# Patient Record
Sex: Female | Born: 1975 | Race: White | Hispanic: No | Smoking: Never smoker
Health system: Southern US, Community
[De-identification: ages and names within clinical notes are randomized; demographics above are authoritative.]

## PROBLEM LIST (undated history)

## (undated) DIAGNOSIS — M797 Fibromyalgia: Secondary | ICD-10-CM

## (undated) DIAGNOSIS — G43019 Migraine without aura, intractable, without status migrainosus: Secondary | ICD-10-CM

## (undated) DIAGNOSIS — J45909 Unspecified asthma, uncomplicated: Secondary | ICD-10-CM

## (undated) DIAGNOSIS — F329 Major depressive disorder, single episode, unspecified: Secondary | ICD-10-CM

## (undated) DIAGNOSIS — F32A Depression, unspecified: Secondary | ICD-10-CM

## (undated) HISTORY — PX: DILATION AND CURETTAGE OF UTERUS: SHX78

## (undated) HISTORY — PX: ABDOMINAL HYSTERECTOMY: SHX81

## (undated) HISTORY — DX: Depression, unspecified: F32.A

## (undated) HISTORY — DX: Migraine without aura, intractable, without status migrainosus: G43.019

## (undated) HISTORY — DX: Major depressive disorder, single episode, unspecified: F32.9

## (undated) HISTORY — DX: Fibromyalgia: M79.7

---

## 1999-03-07 ENCOUNTER — Other Ambulatory Visit: Admission: RE | Admit: 1999-03-07 | Discharge: 1999-03-07 | Payer: Self-pay | Admitting: Obstetrics and Gynecology

## 1999-03-26 ENCOUNTER — Inpatient Hospital Stay (HOSPITAL_COMMUNITY): Admission: AD | Admit: 1999-03-26 | Discharge: 1999-03-26 | Payer: Self-pay | Admitting: Obstetrics and Gynecology

## 1999-04-05 ENCOUNTER — Inpatient Hospital Stay (HOSPITAL_COMMUNITY): Admission: AD | Admit: 1999-04-05 | Discharge: 1999-04-05 | Payer: Self-pay | Admitting: Obstetrics and Gynecology

## 1999-04-27 ENCOUNTER — Ambulatory Visit (HOSPITAL_COMMUNITY): Admission: RE | Admit: 1999-04-27 | Discharge: 1999-04-27 | Payer: Self-pay | Admitting: Obstetrics & Gynecology

## 2000-03-06 ENCOUNTER — Other Ambulatory Visit: Admission: RE | Admit: 2000-03-06 | Discharge: 2000-03-06 | Payer: Self-pay | Admitting: Obstetrics and Gynecology

## 2000-06-24 ENCOUNTER — Ambulatory Visit (HOSPITAL_COMMUNITY): Admission: RE | Admit: 2000-06-24 | Discharge: 2000-06-24 | Payer: Self-pay | Admitting: Obstetrics & Gynecology

## 2000-06-24 ENCOUNTER — Encounter: Payer: Self-pay | Admitting: Obstetrics & Gynecology

## 2001-07-17 ENCOUNTER — Other Ambulatory Visit: Admission: RE | Admit: 2001-07-17 | Discharge: 2001-07-17 | Payer: Self-pay | Admitting: Obstetrics & Gynecology

## 2002-02-22 ENCOUNTER — Inpatient Hospital Stay (HOSPITAL_COMMUNITY): Admission: AD | Admit: 2002-02-22 | Discharge: 2002-02-22 | Payer: Self-pay | Admitting: Obstetrics & Gynecology

## 2002-05-06 ENCOUNTER — Ambulatory Visit: Admission: RE | Admit: 2002-05-06 | Discharge: 2002-05-06 | Payer: Self-pay | Admitting: Obstetrics and Gynecology

## 2002-08-24 ENCOUNTER — Inpatient Hospital Stay (HOSPITAL_COMMUNITY): Admission: AD | Admit: 2002-08-24 | Discharge: 2002-08-26 | Payer: Self-pay | Admitting: Obstetrics & Gynecology

## 2002-09-21 ENCOUNTER — Other Ambulatory Visit: Admission: RE | Admit: 2002-09-21 | Discharge: 2002-09-21 | Payer: Self-pay | Admitting: Obstetrics & Gynecology

## 2003-11-14 ENCOUNTER — Other Ambulatory Visit: Admission: RE | Admit: 2003-11-14 | Discharge: 2003-11-14 | Payer: Self-pay | Admitting: Obstetrics & Gynecology

## 2004-02-01 ENCOUNTER — Encounter: Admission: RE | Admit: 2004-02-01 | Discharge: 2004-02-01 | Payer: Self-pay | Admitting: Internal Medicine

## 2004-11-21 ENCOUNTER — Other Ambulatory Visit: Admission: RE | Admit: 2004-11-21 | Discharge: 2004-11-21 | Payer: Self-pay | Admitting: Obstetrics & Gynecology

## 2005-12-10 ENCOUNTER — Other Ambulatory Visit: Admission: RE | Admit: 2005-12-10 | Discharge: 2005-12-10 | Payer: Self-pay | Admitting: Obstetrics & Gynecology

## 2006-07-25 ENCOUNTER — Emergency Department (HOSPITAL_COMMUNITY): Admission: EM | Admit: 2006-07-25 | Discharge: 2006-07-25 | Payer: Self-pay | Admitting: Emergency Medicine

## 2006-08-15 ENCOUNTER — Ambulatory Visit (HOSPITAL_COMMUNITY): Admission: RE | Admit: 2006-08-15 | Discharge: 2006-08-15 | Payer: Self-pay | Admitting: *Deleted

## 2011-03-22 NOTE — Discharge Summary (Signed)
NAMEBAYLI, QUESINBERRY                            ACCOUNT NO.:  [id number]   MEDICAL RECORD NO.:  [id number]                   PATIENT TYPE:  INP   LOCATION:  9140                                 FACILITY:  [facility]   PHYSICIAN:  Gerrit Friends. Aldona Bar, M.D.                DATE OF BIRTH:  1976-02-03   DATE OF ADMISSION:  08/24/2002  DATE OF DISCHARGE:  08/26/2002                                 DISCHARGE SUMMARY   DISCHARGE DIAGNOSES:  1. Term pregnancy, delivered 9 pound 1 ounce female infant, Apgars 9 and 9.  2. Blood type A positive.  3. Post dates.  4. Positive group B strep.  5. Induction of labor.   PROCEDURES:  1. Vacuum extracted assisted delivery.  2. Second degree tear and repair.   SUMMARY:  This 35 year old gravida 5 para 0 was admitted for induction  because of being post dates [facility] August 11, 2002).  Her pregnancy was  complicated by her history of multiple miscarriages.  During this pregnancy  she was treated with supplemental progesterone vaginal suppositories post  ovulation until [redacted] weeks gestation.  Her pregnancy was also complicated by  hyperemesis and by being post dates.  Her group B strep was positive  antenatally.   At the time of admission she was 3 cm dilated, 80% effaced.  She underwent  amniotomy with production of slightly stained meconium fluid.  Induction  proceeded.  She received intrapartum antibiotic prophylaxis for being  positive for group B strep, and received Pitocin augmentation as well.  After pushing for over two hours with the vertex at +3 station the vacuum  extractor was applied, and after one contraction/pull she was delivered of a  9 pound 1 ounce female infant with Apgars of 9 and 9 over a second degree tear  which was repaired without difficulty.  Her postpartum course was  uncomplicated.  On the morning of October 23 she was ambulating well,  tolerating a regular diet well, having normal bowel and bladder function,  and was bottle feeding without  difficulty, vital signs were stable, and she  was deemed ready for discharge and accordingly wanted to go home.  Discharge  hemoglobin 9.1 with a white count of 18,8000 and a platelet count of  218,000.   DISCHARGE MEDICATIONS:  1. Vitamins - one a day until gone.  2. Ferrous sulfate 300 mg daily until return to the office.  3. Motrin 600 mg q.6h. as needed for pain.  4. Tylox one to two q.4-6h. as needed for severe pain.   DISCHARGE INSTRUCTIONS:  All appropriate instructions were given to the  patient per discharge brochure and were well understood by the patient.  Discharge medications as above.    FOLLOW-UP:  Follow-up in the office in approximately four weeks time.   CONDITION ON DISCHARGE:  Improved.  Gerrit Friends. Aldona Bar, M.D.    RMW/MEDQ  D:  08/26/2002  T:  08/26/2002  Job:  [id number]

## 2011-03-22 NOTE — Cardiovascular Report (Signed)
Barbara Travis, Barbara Travis                  ACCOUNT NO.:  [id number]   MEDICAL RECORD NO.:  [id number]          PATIENT TYPE:  OIB   LOCATION:  2899                         FACILITY:  [facility]   PHYSICIAN:  Darlin Priestly, MD  DATE OF BIRTH:  1976/08/26   DATE OF PROCEDURE:  08/15/2006  DATE OF DISCHARGE:  08/15/2006                              CARDIAC CATHETERIZATION   PROCEDURE:  Heads-up tilt table testing with Isuprel infusion.   COMPLICATIONS:  None.   INDICATION:  [name] is a 35 year old female patient of Dr. Nanetta Batty  with a history of presyncope and syncope.  She is now referred for tilt-  table testing to rule out neurocardiogenic source.   DESCRIPTION OF PROCEDURE:  After obtaining informed consent, patient brought  to the cardiac cath lab in a fasting state.  She was then placed on the tilt  table in the supine position.  Hemodynamic measures were obtained.  Resting  blood pressure 105/68, resting heart rate 70.  She was then monitored for  approximately 5 minutes.  She was then tilted to a 70 degree heads-up  position, which she maintained for 30 minutes without any significant  symptoms.  She was again returned to the supine position and Isuprel  infusion was begun at 0.5 mcg.  This was continued for approximately for 5  minutes, then increased to 1 mcg, and she was then tilted again to a 70  degree heads-up position.  She remained hemodynamically stable with no  change in heart rate or blood pressure.  She did complain of nausea and  presyncopal symptoms, but had no significant hemodynamic changes.  After a  total of 15 minutes of Isuprel, she was returned to the supine position.  Isuprel was discontinued.  She was then transferred to the recovery room in  stable condition.   CONCLUSION:  Negative heads-up tilt-table testing for neurocardiogenic  syncope from a hemodynamic standpoint.  She did develop symptoms of nausea  and presyncope with no significant hemodynamic  effect.      Darlin Priestly, MD  Electronically Signed     RHM/MEDQ  D:  08/15/2006  T:  08/16/2006  Job:  [id number]   cc:   Nanetta Batty, M.D.

## 2012-04-10 ENCOUNTER — Other Ambulatory Visit: Payer: Self-pay | Admitting: Allergy and Immunology

## 2012-04-10 ENCOUNTER — Ambulatory Visit
Admission: RE | Admit: 2012-04-10 | Discharge: 2012-04-10 | Disposition: A | Discharge status: Home / Self Care | Payer: BC Managed Care – PPO | Source: Ambulatory Visit | Attending: Allergy and Immunology | Admitting: Allergy and Immunology

## 2012-04-10 DIAGNOSIS — J019 Acute sinusitis, unspecified: Secondary | ICD-10-CM

## 2014-02-22 ENCOUNTER — Ambulatory Visit
Admission: RE | Admit: 2014-02-22 | Discharge: 2014-02-22 | Disposition: A | Discharge status: Home / Self Care | Payer: BC Managed Care – PPO | Source: Ambulatory Visit | Attending: Allergy and Immunology | Admitting: Allergy and Immunology

## 2014-02-22 ENCOUNTER — Other Ambulatory Visit: Payer: Self-pay | Admitting: Allergy and Immunology

## 2014-02-22 DIAGNOSIS — J329 Chronic sinusitis, unspecified: Secondary | ICD-10-CM

## 2014-03-23 ENCOUNTER — Encounter: Payer: Self-pay | Admitting: *Deleted

## 2014-03-24 ENCOUNTER — Encounter: Payer: Self-pay | Admitting: Neurology

## 2014-03-24 ENCOUNTER — Telehealth: Payer: Self-pay | Admitting: Neurology

## 2014-03-24 ENCOUNTER — Encounter (INDEPENDENT_AMBULATORY_CARE_PROVIDER_SITE_OTHER): Payer: Self-pay

## 2014-03-24 ENCOUNTER — Ambulatory Visit (INDEPENDENT_AMBULATORY_CARE_PROVIDER_SITE_OTHER): Payer: BC Managed Care – PPO | Admitting: Neurology

## 2014-03-24 VITALS — BP 115/80 | HR 80 | Ht 66.5 in | Wt 186.0 lb

## 2014-03-24 DIAGNOSIS — G43019 Migraine without aura, intractable, without status migrainosus: Secondary | ICD-10-CM

## 2014-03-24 HISTORY — DX: Migraine without aura, intractable, without status migrainosus: G43.019

## 2014-03-24 MED ORDER — TOPIRAMATE 25 MG PO TABS: ORAL_TABLET | ORAL | Status: DC

## 2014-03-24 NOTE — Telephone Encounter (Signed)
I called the patient. I left a message. I will call back tomorrow.

## 2014-03-24 NOTE — Progress Notes (Signed)
Reason for visit: Headache  Barbara Travis is a 38 y.o. female  History of present illness:  Barbara Travis is a 38 year old right-handed white female with a history of headaches in the past. The patient was having on average of 2 headaches a month, but beginning in September 2014, she began having a constant pain underneath the right eye. Initially it was felt to be related to sinus disease, and she was treated for sinusitis. The headaches do not abate, and eventually the patient underwent a CT sinus evaluation that did not show sinusitis. The patient indicates that she is also getting headaches associated with spots in front of the eyes, spreading to a right-sided headache and then generalizing. The headache may be associated with some right-sided neck stiffness and pain. She may have some nausea, no vomiting. The patient reports no numbness or weakness of the face, arms, or legs. The patient indicates that her headaches may last several days, and she may have up to 9 or 10 headache days a month. The pain underneath the right eye is always present. The headaches may be associated with a pressure or squeezing sensation. She indicates that she has no problems controlling the bowels or the bladder or any balance issues. The patient has taken Imitrex in the past without benefit, and she currently is on Maxalt with modest benefit. She is sent to this office for an evaluation.  Past Medical History  Diagnosis Date  . Obese   . Depression   . Fibromyalgia   . Migraine without aura, with intractable migraine, so stated, without mention of status migrainosus 03/24/2014    Past Surgical History  Procedure Laterality Date  . None    . Dnc    . Dilation and curettage of uterus      Family History  Problem Relation Age of Onset  . Migraines Mother   . Colon cancer Father   . Cancer Maternal Grandfather   . Heart disease Maternal Grandfather   . Cancer Paternal Grandmother   . Cancer Paternal  Grandfather     Social history:  reports that she has never smoked. She has never used smokeless tobacco. She reports that she drinks alcohol. She reports that she does not use illicit drugs.  Medications:  No current outpatient prescriptions on file prior to visit.   No current facility-administered medications on file prior to visit.     No Known Allergies  ROS:  Out of a complete 14 system review of symptoms, the patient complains only of the following symptoms, and all other reviewed systems are negative.  Weight gain, fatigue Ringing in the ears Blurred vision, eye pain Shortness of breath Constipation Easy bruising Flushing Achy muscles Allergies, skin sensitivity Confusion, headache, dizziness Depression, too much sleep, insomnia, decreased energy, disinterest in activities, racing thoughts  Blood pressure 115/80, pulse 80, height 5' 6.5" (1.689 m), weight 186 lb (84.369 kg), last menstrual period 01/20/2014.  Physical Exam  General: The patient is alert and cooperative at the time of the examination. The patient is minimally to moderately obese.  Eyes: Pupils are equal, round, and reactive to light. Discs are flat bilaterally.  Neck: The neck is supple, no carotid bruits are noted.  Respiratory: The respiratory examination is clear.  Cardiovascular: The cardiovascular examination reveals a regular rate and rhythm, no obvious murmurs or rubs are noted.  Neuromuscular: The patient has good range of movement of the cervical spine. No crepitus is noted in the temporomandibular joints.  Skin: Extremities are without significant edema.  Neurologic Exam  Mental status: The patient is alert and oriented x 3 at the time of the examination. The patient has apparent normal recent and remote memory, with an apparently normal attention span and concentration ability.  Cranial nerves: Facial symmetry is present. There is good sensation of the face to pinprick and soft  touch bilaterally. The strength of the facial muscles and the muscles to head turning and shoulder shrug are normal bilaterally. Speech is well enunciated, no aphasia or dysarthria is noted. Extraocular movements are full. Visual fields are full. The tongue is midline, and the patient has symmetric elevation of the soft palate. No obvious hearing deficits are noted.  Motor: The motor testing reveals 5 over 5 strength of all 4 extremities. Good symmetric motor tone is noted throughout.  Sensory: Sensory testing is intact to pinprick, soft touch, vibration sensation, and position sense on all 4 extremities. No evidence of extinction is noted.  Coordination: Cerebellar testing reveals good finger-nose-finger and heel-to-shin bilaterally.  Gait and station: Gait is normal. Tandem gait is normal. Romberg is negative. No drift is seen.  Reflexes: Deep tendon reflexes are symmetric and normal bilaterally. Toes are downgoing bilaterally.   Assessment/Plan:  1. Probable migraine headache  The patient appears to have had increased frequency of headaches in September 2014. The patient will be sent for MRI evaluation of the brain given the new type of headache. The patient does have some features of mixed tension headache as well. The patient will be placed on Topamax, and she will continue take Maxalt if needed. She will followup through this office in 3-4 months.  Jill Alexanders MD 03/24/2014 8:41 PM  [facility] [address] [place], [place] [address]  Phone [phone] Fax [phone]

## 2014-03-24 NOTE — Patient Instructions (Signed)
   Topamax (topiramate) is a seizure medication that has an FDA approval for seizures and for migraine headache. Potential side effects of this medication include weight loss, cognitive slowing, tingling in the fingers and toes, and carbonated drinks will taste bad. If any significant side effects are noted on this drug, please contact our office.   Migraine Headache A migraine headache is an intense, throbbing pain on one or both sides of your head. A migraine can last for 30 minutes to several hours. CAUSES  The exact cause of a migraine headache is not always known. However, a migraine may be caused when nerves in the brain become irritated and release chemicals that cause inflammation. This causes pain. Certain things may also trigger migraines, such as:  Alcohol.  Smoking.  Stress.  Menstruation.  Aged cheeses.  Foods or drinks that contain nitrates, glutamate, aspartame, or tyramine.  Lack of sleep.  Chocolate.  Caffeine.  Hunger.  Physical exertion.  Fatigue.  Medicines used to treat chest pain (nitroglycerine), birth control pills, estrogen, and some blood pressure medicines. SIGNS AND SYMPTOMS  Pain on one or both sides of your head.  Pulsating or throbbing pain.  Severe pain that prevents daily activities.  Pain that is aggravated by any physical activity.  Nausea, vomiting, or both.  Dizziness.  Pain with exposure to bright lights, loud noises, or activity.  General sensitivity to bright lights, loud noises, or smells. Before you get a migraine, you may get warning signs that a migraine is coming (aura). An aura may include:  Seeing flashing lights.  Seeing bright spots, halos, or zig-zag lines.  Having tunnel vision or blurred vision.  Having feelings of numbness or tingling.  Having trouble talking.  Having muscle weakness. DIAGNOSIS  A migraine headache is often diagnosed based on:  Symptoms.  Physical exam.  A CT scan or MRI of  your head. These imaging tests cannot diagnose migraines, but they can help rule out other causes of headaches. TREATMENT Medicines may be given for pain and nausea. Medicines can also be given to help prevent recurrent migraines.  HOME CARE INSTRUCTIONS  Only take over-the-counter or prescription medicines for pain or discomfort as directed by your health care provider. The use of long-term narcotics is not recommended.  Lie down in a dark, quiet room when you have a migraine.  Keep a journal to find out what may trigger your migraine headaches. For example, write down:  What you eat and drink.  How much sleep you get.  Any change to your diet or medicines.  Limit alcohol consumption.  Quit smoking if you smoke.  Get 7 9 hours of sleep, or as recommended by your health care provider.  Limit stress.  Keep lights dim if bright lights bother you and make your migraines worse. SEEK IMMEDIATE MEDICAL CARE IF:   Your migraine becomes severe.  You have a fever.  You have a stiff neck.  You have vision loss.  You have muscular weakness or loss of muscle control.  You start losing your balance or have trouble walking.  You feel faint or pass out.  You have severe symptoms that are different from your first symptoms. MAKE SURE YOU:   Understand these instructions.  Will watch your condition.  Will get help right away if you are not doing well or get worse. Document Released: 10/21/2005 Document Revised: 08/11/2013 Document Reviewed: 06/28/2013 [facility] Patient Information 2014 [place].

## 2014-03-24 NOTE — Telephone Encounter (Signed)
Patient at check out today states she forgot to ask Dr. Jannifer Franklin about something to help with the migraine that she currently has. Please call and advise patient.

## 2014-03-25 ENCOUNTER — Telehealth: Payer: Self-pay | Admitting: Neurology

## 2014-03-25 NOTE — Telephone Encounter (Signed)
I called the patient again, left a message again. The patient is to let us know if she still has issues with the headache, she is to give [place] a number to call that she can be reached at.

## 2014-03-25 NOTE — Telephone Encounter (Signed)
I called patient. The patient still has a migraine-type headache, not just the headache underneath the eye. She indicates that in the past, prednisone that she took for her sinuses did not help these headaches. I have recommended getting a Depacon injection to her office, but the patient does not believe that she can leave work today to get the injection done. She will see how the headache does throughout the weekend, and if it is still a problem next week, she will call, and we will get a Depacon injection set up.

## 2014-03-25 NOTE — Telephone Encounter (Signed)
Patient is returning a call--please call work#[phone] not cell# please--thank you.

## 2014-03-25 NOTE — Telephone Encounter (Signed)
Patient returning call and states headache hasn't improved with Maxalt, wanted to know if she could take something else.  Please call work number until 5:00 [phone] after 5 call home # [phone].

## 2014-03-25 NOTE — Telephone Encounter (Signed)
Called patient and left VM message from Dr Tobey Grim note

## 2014-04-07 ENCOUNTER — Telehealth: Payer: Self-pay | Admitting: Neurology

## 2014-04-07 NOTE — Telephone Encounter (Signed)
Patient calling to state that the Topamax is helping the headaches; however, she is experiencing side effects of tingling and burning in her feet and would like to know what she can do for this. Please call to advise.

## 2014-04-18 ENCOUNTER — Ambulatory Visit: Payer: BC Managed Care – PPO | Admitting: Neurology

## 2014-07-13 ENCOUNTER — Telehealth: Payer: Self-pay | Admitting: Neurology

## 2014-07-13 MED ORDER — TOPIRAMATE 100 MG PO TABS: 100.0000 mg | ORAL_TABLET | Freq: Two times a day (BID) | ORAL | Status: DC

## 2014-07-13 NOTE — Telephone Encounter (Signed)
Patient has questions about her medication - she has allergies which cause her migraines. In the past month she has had 3 migraines lasting several days. She is wondering if she could increase the Topamax? After business hours call the cell phone [phone] and during business hours [phone]

## 2014-07-13 NOTE — Telephone Encounter (Signed)
I called patient. The patient has a lot of allergy problems, and her headaches seem to be activated recently with the ragweed season. I will go on Topamax, taking 100 mg at night.

## 2014-07-13 NOTE — Telephone Encounter (Signed)
Patient has questions about her medication - she has allergies which cause her migraines. In the past month she has had 3 migraines lasting several days. She is wondering if she could increase the Topamax?  After business hours call the cell phone [phone] and during business hours [phone].

## 2014-07-26 ENCOUNTER — Ambulatory Visit: Payer: BC Managed Care – PPO | Admitting: Adult Health

## 2014-08-01 ENCOUNTER — Other Ambulatory Visit: Payer: Self-pay | Admitting: Neurology

## 2014-08-01 NOTE — Telephone Encounter (Signed)
Barbara Ducking, MD at 07/13/2014 3:44 PM     Status: Signed        I called patient. The patient has a lot of allergy problems, and her headaches seem to be activated recently with the ragweed season. I will go on Topamax, taking 100 mg at night.

## 2014-08-04 ENCOUNTER — Encounter (INDEPENDENT_AMBULATORY_CARE_PROVIDER_SITE_OTHER): Payer: Self-pay

## 2014-08-04 ENCOUNTER — Encounter: Payer: Self-pay | Admitting: Adult Health

## 2014-08-04 ENCOUNTER — Ambulatory Visit (INDEPENDENT_AMBULATORY_CARE_PROVIDER_SITE_OTHER): Payer: BC Managed Care – PPO | Admitting: Adult Health

## 2014-08-04 VITALS — BP 117/78 | HR 91 | Ht 65.0 in | Wt 189.0 lb

## 2014-08-04 DIAGNOSIS — H539 Unspecified visual disturbance: Secondary | ICD-10-CM

## 2014-08-04 DIAGNOSIS — G43109 Migraine with aura, not intractable, without status migrainosus: Secondary | ICD-10-CM

## 2014-08-04 NOTE — Progress Notes (Signed)
I have read the note, and I agree with the clinical assessment and plan.  Thelonious Kauffmann KEITH   

## 2014-08-04 NOTE — Progress Notes (Signed)
PATIENT: Barbara Travis DOB: [birth year]  REASON FOR VISIT: follow up HISTORY FROM: patient  HISTORY OF PRESENT ILLNESS: Barbara Travis is a 38 year old feamle with a history of headaches. She returns today for follow-up. She is currently taking Topamax 100 mg BID. She was having 1 headache a week that would last about 4 days but since he increased the Topamax she has had not any migraines. She states that she has had some visual changes since she was started on the Topamax. She describes it as a single air bubble that floats in her peripheral vision bilaterally. She states it will float from the bottom to the top, then she will develop blurry vision but then it resolves all within seconds. These visual changes it is not followed by a headache. She states that they have been so quickly that she has not been keeping up with how often they occur. The visual changes do not interfere with her daily activities. No other symptoms occur before after the visual disturbance. Patient also states that she has been getting a smell of ammonia before a headache. She states that this smell is always followed by a migraine. She states that the smell can last only seconds and then minutes later a migraine will begin.  REVIEW OF SYSTEMS: Full 14 system review of systems performed and notable only for:  Constitutional: N/A  Eyes: Blurred vision Ear/Nose/Throat: Ringing in the ears Skin: N/A  Cardiovascular: N/A  Respiratory: N/A  Gastrointestinal: N/A  Genitourinary: N/A Hematology/Lymphatic: N/A  Endocrine: N/A Musculoskeletal:N/A  Allergy/Immunology: N/A  Neurological: Headache Psychiatric: Decreased concentration Sleep: Daytime sleepiness, sleep talking   ALLERGIES: No Known Allergies  HOME MEDICATIONS: Outpatient Prescriptions Prior to Visit  Medication Sig Dispense Refill  . ALPRAZolam (XANAX) 0.5 MG tablet Take 0.5 mg by mouth as needed.      Marland Kitchen amphetamine-dextroamphetamine (ADDERALL) 30 MG tablet  Take 30 mg by mouth 2 (two) times daily.      Marland Kitchen azelastine (ASTELIN) 0.1 % nasal spray Place 1 spray into both nostrils 2 (two) times daily. Use in each nostril as directed      . escitalopram (LEXAPRO) 20 MG tablet Take 20 mg by mouth daily.      . hydrOXYzine (ATARAX/VISTARIL) 25 MG tablet Take 25 mg by mouth as needed.      . lamoTRIgine (LAMICTAL) 200 MG tablet Take 400 mg by mouth daily.       . meclizine (ANTIVERT) 12.5 MG tablet Take 12.5 mg by mouth 3 (three) times daily as needed for dizziness.      . mometasone (NASONEX) 50 MCG/ACT nasal spray Place 2 sprays into the nose daily.      . montelukast (SINGULAIR) 10 MG tablet Take 10 mg by mouth daily.      . naproxen sodium (ANAPROX) 220 MG tablet Take 220 mg by mouth as needed.      . phenylephrine (SUDAFED PE) 10 MG TABS tablet Take 10 mg by mouth every 4 (four) hours as needed.      . topiramate (TOPAMAX) 100 MG tablet Take 1 tablet (100 mg total) by mouth 2 (two) times daily.  30 tablet  3  . topiramate (TOPAMAX) 100 MG tablet Take 1 tablet (100 mg total) by mouth Nightly.  90 tablet  1  . vitamin A 7500 UNIT capsule Take 7,500 Units by mouth daily. 5 daily for skin       No facility-administered medications prior to visit.  PAST MEDICAL HISTORY: Past Medical History  Diagnosis Date  . Obese   . Depression   . Fibromyalgia   . Migraine without aura, with intractable migraine, so stated, without mention of status migrainosus 03/24/2014    PAST SURGICAL HISTORY: Past Surgical History  Procedure Laterality Date  . None    . Dnc    . Dilation and curettage of uterus      FAMILY HISTORY: Family History  Problem Relation Age of Onset  . Migraines Mother   . Colon cancer Father   . Cancer Maternal Grandfather   . Heart disease Maternal Grandfather   . Cancer Paternal Grandmother   . Cancer Paternal Grandfather     SOCIAL HISTORY: History   Social History  . Marital Status: Married    Spouse Name: N/A    Number  of Children: 1  . Years of Education: trade sch   Occupational History  .  [employer]   Social History Main Topics  . Smoking status: Never Smoker   . Smokeless tobacco: Never Used  . Alcohol Use: Yes     Comment: seldom 4-5 times per year  . Drug Use: No  . Sexual Activity: Not on file   Other Topics Concern  . Not on file   Social History Narrative   Patient lives at home with boyfriend.    Patient has 1 child.    Patient is right handed.    Patient has 12+ years of education.       PHYSICAL EXAM  Filed Vitals:   08/04/14 1536  BP: 117/78  Pulse: 91  Height: 5\' 5"  (1.651 m)  Weight: 189 lb (85.73 kg)   Body mass index is 31.45 kg/(m^2).  Generalized: Well developed, in no acute distress   Neurological examination  Mentation: Alert oriented to time, place, history taking. Follows all commands speech and language fluent Cranial nerve II-XII: Pupils were equal round reactive to light. Extraocular movements were full, visual field were full on confrontational test. Facial sensation and strength were normal. Uvula tongue midline. Head turning and shoulder shrug  were normal and symmetric. Motor: The motor testing reveals 5 over 5 strength of all 4 extremities. Good symmetric motor tone is noted throughout.  Sensory: Sensory testing is intact to soft touch on all 4 extremities. No evidence of extinction is noted.  Coordination: Cerebellar testing reveals good finger-nose-finger and heel-to-shin bilaterally.  Gait and station: Gait is normal. Tandem gait is normal. Romberg is negative. No drift is seen.  Reflexes: Deep tendon reflexes are symmetric and normal bilaterally.    DIAGNOSTIC DATA (LABS, IMAGING, TESTING) - I reviewed patient records, labs, notes, testing and imaging myself where available.  ASSESSMENT AND PLAN 38 y.o. year old female  has a past medical history of Obese; Depression; Fibromyalgia; and Migraine without aura, with intractable  migraine, so stated, without mention of status migrainosus (03/24/2014). here with:  1. Migraine 2. visual disturbance  Patient states that the increase in Topamax has controlled her migraines. She is very pleased with Topamax however she is having these visual disturbances. These visual changes are not associated with the headaches however they did start after she began taking Topamax. This could be an uncommon side effect of Topamax however I have requested that the patient followup with an ophthalmologist. If the ophthalmologist exam is unremarkable then the patient will have to decide if she would like to be taken off Topamax to see if that resolves her visual disturbances. Patient  is also having what appears to be an olfactory aura with her migraines. She will sometimes smell "Ammonia" and then the headache will ensue. If the smell becomes more frequent and not associated with her headaches the patient should let us know.  Otherwise she will followup in 4 months or sooner if needed.  Ward Givens, MSN, NP-C 08/04/2014, 4:01 PM [facility] [address] [place], [place] [address] [phone]  Note: This document was prepared with digital dictation and possible smart phrase technology. Any transcriptional errors that result from this process are unintentional.

## 2014-08-04 NOTE — Patient Instructions (Signed)

## 2014-09-11 ENCOUNTER — Other Ambulatory Visit: Payer: Self-pay | Admitting: Neurology

## 2014-12-06 ENCOUNTER — Ambulatory Visit: Payer: BC Managed Care – PPO | Admitting: Adult Health

## 2014-12-19 ENCOUNTER — Ambulatory Visit: Payer: Self-pay | Admitting: Adult Health

## 2014-12-19 ENCOUNTER — Telehealth: Payer: Self-pay | Admitting: Adult Health

## 2014-12-19 NOTE — Telephone Encounter (Signed)
I called the patient to let her know our office would be closed today due to the weather.

## 2014-12-20 ENCOUNTER — Telehealth: Payer: Self-pay | Admitting: *Deleted

## 2014-12-20 NOTE — Telephone Encounter (Signed)
tried to rescheduled the appointment for the patient

## 2015-02-06 ENCOUNTER — Other Ambulatory Visit: Payer: Self-pay | Admitting: Neurology

## 2015-02-14 ENCOUNTER — Encounter: Payer: Self-pay | Admitting: Adult Health

## 2015-02-14 ENCOUNTER — Ambulatory Visit (INDEPENDENT_AMBULATORY_CARE_PROVIDER_SITE_OTHER): Payer: BLUE CROSS/BLUE SHIELD | Admitting: Adult Health

## 2015-02-14 VITALS — BP 112/75 | HR 78 | Ht 65.0 in | Wt 194.0 lb

## 2015-02-14 DIAGNOSIS — G43109 Migraine with aura, not intractable, without status migrainosus: Secondary | ICD-10-CM | POA: Diagnosis not present

## 2015-02-14 MED ORDER — RIZATRIPTAN BENZOATE 10 MG PO TBDP: 10.0000 mg | ORAL_TABLET | ORAL | Status: AC | PRN

## 2015-02-14 MED ORDER — TOPIRAMATE 100 MG PO TABS: 100.0000 mg | ORAL_TABLET | Freq: Two times a day (BID) | ORAL | Status: AC

## 2015-02-14 NOTE — Progress Notes (Signed)
I have read the note, and I agree with the clinical assessment and plan.  WILLIS,CHARLES KEITH   

## 2015-02-14 NOTE — Patient Instructions (Signed)
Continue Topamax.  I will refill Maxalt and Topamax. If your symptoms worsen please let us know.

## 2015-02-14 NOTE — Progress Notes (Signed)
PATIENT: Barbara Travis DOB: 05/28/1976  REASON FOR VISIT: follow up- Migraine HISTORY FROM: patient  HISTORY OF PRESENT ILLNESS: Barbara Travis is a 39 year old female with a history of migraines. She returns today for follow-up. She is currently taking Topamax 100 mg twice a day. She is having approximately one headache a month. She states occasionally she will have the smell of ammonia before a headache. At the last visit the patient was having some visual changes. She followed up with her ophthalmologist and her exam was normal. She states since then she may get the visual changes but usually associated with a headache. She states that at this time it is not severe enough that she would want to stop the Topamax. Patient has been taking Maxalt for acute therapy and this has worked well for her. Patient states that she continues to have the facial pain on the right associated with swelling. She describes her pain as generalized but denies sharp shooting pain. She is followed by an allergist who feels that she is allergic to something that is causing the facial pain/swelling. She also describes today that she has red patchy areas that will come up on the hands and feet associated with a burning pain. She states that it doesn't happen constantly but when it does happen it usually resolves shortly after. She has not talked to her primary care about this yet. 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 She returns today for evaluation  HISTORY 08/04/14: Barbara Travis is a 39 year old feamle with a history of headaches. She returns today for follow-up. She is currently taking Topamax 100 mg BID. She was having 1 headache a week that would last about 4 days but since he increased the Topamax she has had not any migraines. She states that she has had some visual changes since she was started on the Topamax. She describes it as a single air bubble that floats in her peripheral vision bilaterally. She states it will  float from the bottom to the top, then she will develop blurry vision but then it resolves all within seconds. These visual changes it is not followed by a headache. She states that they have been so quickly that she has not been keeping up with how often they occur. The visual changes do not interfere with her daily activities. No other symptoms occur before after the visual disturbance. Patient also states that she has been getting a smell of ammonia before a headache. She states that this smell is always followed by a migraine. She states that the smell can last only seconds and then minutes later a migraine will begin.  REVIEW OF SYSTEMS: Out of a complete 14 system review of symptoms, the patient complains only of the following symptoms, and all other reviewed systems are negative.  Facial swelling, ringing in ears, heat intolerance, headache, agitation, confusion, rash  ALLERGIES: No Known Allergies  HOME MEDICATIONS: Outpatient Prescriptions Prior to Visit  Medication Sig Dispense Refill  . ALPRAZolam (XANAX) 0.5 MG tablet Take 0.5 mg by mouth as needed.    Marland Kitchen amphetamine-dextroamphetamine (ADDERALL) 30 MG tablet Take 30 mg by mouth 2 (two) times daily.    Marland Kitchen azelastine (ASTELIN) 0.1 % nasal spray Place 1 spray into both nostrils 2 (two) times daily. Use in each nostril as directed    . escitalopram (LEXAPRO) 20 MG tablet Take 20 mg by mouth daily.    . hydrOXYzine (ATARAX/VISTARIL) 25 MG tablet Take 25 mg by mouth  as needed.    . lamoTRIgine (LAMICTAL) 200 MG tablet Take 400 mg by mouth daily.     . meclizine (ANTIVERT) 12.5 MG tablet Take 12.5 mg by mouth 3 (three) times daily as needed for dizziness.    . mometasone (NASONEX) 50 MCG/ACT nasal spray Place 2 sprays into the nose daily.    . montelukast (SINGULAIR) 10 MG tablet Take 10 mg by mouth daily.    . naproxen sodium (ANAPROX) 220 MG tablet Take 220 mg by mouth as needed.    . phenylephrine (SUDAFED PE) 10 MG TABS tablet Take 10 mg  by mouth every 4 (four) hours as needed.    . topiramate (TOPAMAX) 100 MG tablet Take 1 tablet (100 mg total) by mouth 2 (two) times daily. 30 tablet 3  . topiramate (TOPAMAX) 100 MG tablet Take 1 tablet (100 mg total) by mouth 2 (two) times daily. 180 tablet 0   No facility-administered medications prior to visit.    PAST MEDICAL HISTORY: Past Medical History  Diagnosis Date  . Obese   . Depression   . Fibromyalgia   . Migraine without aura, with intractable migraine, so stated, without mention of status migrainosus 03/24/2014    PAST SURGICAL HISTORY: Past Surgical History  Procedure Laterality Date  . None    . Dnc    . Dilation and curettage of uterus      FAMILY HISTORY: Family History  Problem Relation Age of Onset  . Migraines Mother   . Colon cancer Father   . Cancer Maternal Grandfather   . Heart disease Maternal Grandfather   . Cancer Paternal Grandmother   . Cancer Paternal Grandfather     SOCIAL HISTORY: History   Social History  . Marital Status: Married    Spouse Name: N/A  . Number of Children: 1  . Years of Education: trade sch   Occupational History  .  [employer]   Social History Main Topics  . Smoking status: Never Smoker   . Smokeless tobacco: Never Used  . Alcohol Use: Yes     Comment: seldom 4-5 times per year  . Drug Use: No  . Sexual Activity: Not on file   Other Topics Concern  . Not on file   Social History Narrative   Patient lives at home with boyfriend.    Patient has 1 child.    Patient is right handed.    Patient has 12+ years of education.       PHYSICAL EXAM  Filed Vitals:   02/14/15 1356  BP: 112/75  Pulse: 78  Height: 5\' 5"  (1.651 m)  Weight: 194 lb (87.998 kg)   Body mass index is 32.28 kg/(m^2).  Generalized: Well developed, in no acute distress   Neurological examination  Mentation: Alert oriented to time, place, history taking. Follows all commands speech and language fluent Cranial  nerve II-XII: Pupils were equal round reactive to light. Extraocular movements were full, visual field were full on confrontational test. Facial sensation and strength were normal. Uvula tongue midline. Head turning and shoulder shrug  were normal and symmetric. Motor: The motor testing reveals 5 over 5 strength of all 4 extremities. Good symmetric motor tone is noted throughout.  Sensory: Sensory testing is intact to soft touch on all 4 extremities. No evidence of extinction is noted.  Coordination: Cerebellar testing reveals good finger-nose-finger and heel-to-shin bilaterally.  Gait and station: Gait is normal. Tandem gait is normal. Romberg is negative. No drift is seen.  Reflexes: Deep tendon reflexes are symmetric and normal bilaterally.     DIAGNOSTIC DATA (LABS, IMAGING, TESTING) - I reviewed patient records, labs, notes, testing and imaging myself where available.     ASSESSMENT AND PLAN 39 y.o. year old female  has a past medical history of Obese; Depression; Fibromyalgia; and Migraine without aura, with intractable migraine, so stated, without mention of status migrainosus (03/24/2014). here with:  1. Migraine 2. Red lesions on feet and hands, intermittently.  The patient will continue taking Maxalt 100 mg twice a day. I will refill this today. I will also give her a new prescription for Maxalt. I have advised the patient that if her migraines worsen she should let us know. The patient should follow up with her primary care regarding the new red lesions that she expresses on the hands and feet intermittently. Patient verbalized understanding. If her symptoms worsen or she develops new symptoms she should let us know. Otherwise she will follow-up in 6 months or sooner if needed.  I spent 25 minutes with the patient 50% of this time was spent discussing new symptoms.     Ward Givens, MSN, NP-C 02/14/2015, 1:56 PM [facility] [address] [place], [place] [address] [phone]  Note: This document was prepared with digital dictation and possible smart phrase technology. Any transcriptional errors that result from this process are unintentional.

## 2015-03-01 ENCOUNTER — Ambulatory Visit (INDEPENDENT_AMBULATORY_CARE_PROVIDER_SITE_OTHER): Payer: BLUE CROSS/BLUE SHIELD

## 2015-03-01 DIAGNOSIS — G43019 Migraine without aura, intractable, without status migrainosus: Secondary | ICD-10-CM | POA: Diagnosis not present

## 2015-03-02 ENCOUNTER — Telehealth: Payer: Self-pay | Admitting: Neurology

## 2015-03-02 NOTE — Telephone Encounter (Signed)
MRI brain was normal. I sent the patient the results via Mychart.  03/02/15 MRI brain:  IMPRESSION:  Normal MRI brain (with and without).

## 2015-08-17 ENCOUNTER — Ambulatory Visit: Payer: BLUE CROSS/BLUE SHIELD | Admitting: Neurology

## 2017-10-16 ENCOUNTER — Other Ambulatory Visit: Payer: Self-pay | Admitting: Obstetrics and Gynecology

## 2017-10-16 DIAGNOSIS — D259 Leiomyoma of uterus, unspecified: Secondary | ICD-10-CM

## 2017-10-22 ENCOUNTER — Other Ambulatory Visit: Payer: Self-pay

## 2017-10-23 ENCOUNTER — Ambulatory Visit
Admission: RE | Admit: 2017-10-23 | Discharge: 2017-10-23 | Disposition: A | Discharge status: Home / Self Care | Payer: 59 | Source: Ambulatory Visit | Attending: Obstetrics and Gynecology | Admitting: Obstetrics and Gynecology

## 2017-10-23 DIAGNOSIS — D259 Leiomyoma of uterus, unspecified: Secondary | ICD-10-CM

## 2017-11-11 ENCOUNTER — Encounter (HOSPITAL_BASED_OUTPATIENT_CLINIC_OR_DEPARTMENT_OTHER): Payer: Self-pay | Admitting: *Deleted

## 2017-11-27 ENCOUNTER — Other Ambulatory Visit: Payer: Self-pay | Admitting: Obstetrics and Gynecology

## 2017-11-27 NOTE — Progress Notes (Signed)
Please place orders in epic pt. Has a preop apt 12-01-17 @ 2:00 pm . Thank You!!

## 2017-11-27 NOTE — H&P (Signed)
42 y.o. yo complains of severe pelvic pain possibly related to degenerating fibroid.  P t has also had irregular bleeding.  All options had been d/w pt and she desires definitive.  Previously:"Pt. here for pre op visit for TLH/bilateral salpingectomies on 12/05/17; Tramadol is helping with pain/tb///w pt in detail procedure.\7 cm posterior fibroid that may have grown quickly; unplanned weight loss; family hx of cancer in family: need MRI to characterized fibroid and put cancer at lowest likelihood before considering surgery.\Most likely degenerating fibroid and causing pain with necrosis; will treat pain in meantime with Anaprox and Lysteda.\Pt qualifies for My Risk- sheet filled out but will talk to pt re: genetic counseling when feeling a little better.\Spent over 20 minutes face to face with patient.leiomyomauterine fibroids: care instructionsLysteda 650 mg tablet 2 tablets 3 times a day for 7 daysAnaprox DS 550 mg tablet 1 tablet every 12 hoursMRI, pelvis, w/o contrast\MRI c/w [place]."  pelvis 9x7x6, EM 8.4 mm; with 7 cm sub mucosal fibroid; LO with 2 cm cyst with fluid/fluid level; no ff.  Past Medical History:  Diagnosis Date  . Depression   . Fibromyalgia   . Migraine without aura, with intractable migraine, so stated, without mention of status migrainosus 03/24/2014  . Obese    Past Surgical History:  Procedure Laterality Date  . DILATION AND CURETTAGE OF UTERUS    . dnc    . none      Social History   Socioeconomic History  . Marital status: Married    Spouse name: Not on file  . Number of children: 1  . Years of education: trade sch  . Highest education level: Not on file  Social Needs  . Financial resource strain: Not on file  . Food insecurity - worry: Not on file  . Food insecurity - inability: Not on file  . Transportation needs - medical: Not on file  . Transportation needs - non-medical: Not on file  Occupational History    Employer: [employer]  Tobacco  Use  . Smoking status: Never Smoker  . Smokeless tobacco: Never Used  Substance and Sexual Activity  . Alcohol use: Yes    Comment: seldom 4-5 times per year  . Drug use: No  . Sexual activity: Not on file  Other Topics Concern  . Not on file  Social History Narrative   Patient lives at home with boyfriend.    Patient has 1 child.    Patient is right handed.    Patient has 12+ years of education.     No current facility-administered medications on file prior to encounter.    Current Outpatient Medications on File Prior to Encounter  Medication Sig Dispense Refill  . ALPRAZolam (XANAX) 0.5 MG tablet Take 0.5 mg by mouth as needed.    Marland Kitchen amphetamine-dextroamphetamine (ADDERALL) 30 MG tablet Take 30 mg by mouth 2 (two) times daily.    Marland Kitchen azelastine (ASTELIN) 0.1 % nasal spray Place 1 spray into both nostrils 2 (two) times daily. Use in each nostril as directed    . Beclomethasone Diprop, Nasal, (QNASL NA) Place into the nose as directed.    . hydrOXYzine (ATARAX/VISTARIL) 25 MG tablet Take 25 mg by mouth as needed.    . lamoTRIgine (LAMICTAL) 200 MG tablet Take 400 mg by mouth daily.     . Lurasidone HCl 60 MG TABS Take by mouth. Half tablet daily.    . meclizine (ANTIVERT) 12.5 MG tablet Take 12.5 mg by mouth 3 (three) times  daily as needed for dizziness.    . mometasone (NASONEX) 50 MCG/ACT nasal spray Place 2 sprays into the nose daily.    . montelukast (SINGULAIR) 10 MG tablet Take 10 mg by mouth daily.    . naproxen sodium (ANAPROX) 220 MG tablet Take 220 mg by mouth as needed.    . phenylephrine (SUDAFED PE) 10 MG TABS tablet Take 10 mg by mouth every 4 (four) hours as needed.    . rizatriptan (MAXALT-MLT) 10 MG disintegrating tablet Take 1 tablet (10 mg total) by mouth as needed for migraine. May repeat in 2 hours if needed 9 tablet 11  . topiramate (TOPAMAX) 100 MG tablet Take 1 tablet (100 mg total) by mouth 2 (two) times daily. 180 tablet 3    No Known Allergies  There  were no vitals filed for this visit.  Lungs: clear to ascultation Cor:  RRR Abdomen:  soft, nontender, nondistended. Ex:  no cords, erythema Pelvic:   Vulva: no masses, no atrophy, no lesions\ls1   Bladder/Urethra: normal meatus, no urethral discharge, no urethral mass, bladder non distended\ls1   Vagina no tenderness, no erythema, no abnormal vaginal discharge, no vesicle(s) or ulcers, no cystocele, no rectocele\ls1   Cervix: grossly normal, no discharge, no cervical motion tenderness\ls1   Uterus: normal shape, midline, mobile, non-tender, no uterine prolapse, enlarged (10 weeks, bulky, posterior fibroid felt and very tender. Strings in place.)\ls1 Adnexa/Parametria: no parametrial tenderness, no parametrial mass, no adnexal tenderness, no ovarian mass  A:  For Robotic TLH, salpingectomies, possible BSO or cautery of endometriosis.   P: All risks, benefits and alternatives d/w patient and she desires to proceed.  Patient has undergone a modified diet prep and will receive preop antibiotics and SCDs during the operation.     Denene Alamillo A

## 2017-11-27 NOTE — Progress Notes (Signed)
Pt worried about possible vulvar tears that she has had in past that are difficult to heal.  Will check vulva before and after surgery.

## 2017-11-27 NOTE — Patient Instructions (Addendum)
Barbara Travis  12/01/2017   Your procedure is scheduled on: 12-05-17  Report to [facility] Main  Entrance  Follow signs to Short Stay on first floor at 530 AM  Call this number if you have problems the morning of surgery [phone]    Remember: Do not eat food or drink liquids :After Midnight.     Take these medicines the morning of surgery with A SIP OF WATER: topamax , singulair , lamictal , Lurasidone (latuda)                                You may not have any metal on your body including hair pins and              piercings  Do not wear jewelry, make-up, lotions, powders or perfumes, deodorant             Do not wear nail polish.  Do not shave  48 hours prior to surgery.     Do not bring valuables to the hospital. Door.  Contacts, dentures or bridgework may not be worn into surgery.  Leave suitcase in the car. After surgery it may be brought to your room.                Please read over the following fact sheets you were given: _____________________________________________________________________    [facility] - Preparing for Surgery Before surgery, you can play an important role.  Because skin is not sterile, your skin needs to be as free of germs as possible.  You can reduce the number of germs on your skin by washing with CHG (chlorahexidine gluconate) soap before surgery.  CHG is an antiseptic cleaner which kills germs and bonds with the skin to continue killing germs even after washing. Please DO NOT use if you have an allergy to CHG or antibacterial soaps.  If your skin becomes reddened/irritated stop using the CHG and inform your nurse when you arrive at Short Stay. Do not shave (including legs and underarms) for at least 48 hours prior to the first CHG shower.  You may shave your face/neck. Please follow these instructions carefully:  1.  Shower with CHG Soap the night before surgery  and the  morning of Surgery.  2.  If you choose to wash your hair, wash your hair first as usual with your  normal  shampoo.  3.  After you shampoo, rinse your hair and body thoroughly to remove the  shampoo.                           4.  Use CHG as you would any other liquid soap.  You can apply chg directly  to the skin and wash                       Gently with a scrungie or clean washcloth.  5.  Apply the CHG Soap to your body ONLY FROM THE NECK DOWN.   Do not use on face/ open  Wound or open sores. Avoid contact with eyes, ears mouth and genitals (private parts).                       Wash face,  Genitals (private parts) with your normal soap.             6.  Wash thoroughly, paying special attention to the area where your surgery  will be performed.  7.  Thoroughly rinse your body with warm water from the neck down.  8.  DO NOT shower/wash with your normal soap after using and rinsing off  the CHG Soap.                9.  Pat yourself dry with a clean towel.            10.  Wear clean pajamas.            11.  Place clean sheets on your bed the night of your first shower and do not  sleep with pets. Day of Surgery : Do not apply any lotions/deodorants the morning of surgery.  Please wear clean clothes to the hospital/surgery center.  FAILURE TO FOLLOW THESE INSTRUCTIONS MAY RESULT IN THE CANCELLATION OF YOUR SURGERY PATIENT SIGNATURE_________________________________  NURSE SIGNATURE__________________________________  ________________________________________________________________________   Barbara Travis  An incentive spirometer is a tool that can help keep your lungs clear and active. This tool measures how well you are filling your lungs with each breath. Taking long deep breaths may help reverse or decrease the chance of developing breathing (pulmonary) problems (especially infection) following:  A long period of time when you are unable to move or  be active. BEFORE THE PROCEDURE   If the spirometer includes an indicator to show your best effort, your nurse or respiratory therapist will set it to a desired goal.  If possible, sit up straight or lean slightly forward. Try not to slouch.  Hold the incentive spirometer in an upright position. INSTRUCTIONS FOR USE  1. Sit on the edge of your bed if possible, or sit up as far as you can in bed or on a chair. 2. Hold the incentive spirometer in an upright position. 3. Breathe out normally. 4. Place the mouthpiece in your mouth and seal your lips tightly around it. 5. Breathe in slowly and as deeply as possible, raising the piston or the ball toward the top of the column. 6. Hold your breath for 3-5 seconds or for as long as possible. Allow the piston or ball to fall to the bottom of the column. 7. Remove the mouthpiece from your mouth and breathe out normally. 8. Rest for a few seconds and repeat Steps 1 through 7 at least 10 times every 1-2 hours when you are awake. Take your time and take a few normal breaths between deep breaths. 9. The spirometer may include an indicator to show your best effort. Use the indicator as a goal to work toward during each repetition. 10. After each set of 10 deep breaths, practice coughing to be sure your lungs are clear. If you have an incision (the cut made at the time of surgery), support your incision when coughing by placing a pillow or rolled up towels firmly against it. Once you are able to get out of bed, walk around indoors and cough well. You may stop using the incentive spirometer when instructed by your caregiver.  RISKS AND COMPLICATIONS  Take your time so you do not get  dizzy or light-headed.  If you are in pain, you may need to take or ask for pain medication before doing incentive spirometry. It is harder to take a deep breath if you are having pain. AFTER USE  Rest and breathe slowly and easily.  It can be helpful to keep track of a log  of your progress. Your caregiver can provide you with a simple table to help with this. If you are using the spirometer at home, follow these instructions: [place] IF:   You are having difficultly using the spirometer.  You have trouble using the spirometer as often as instructed.  Your pain medication is not giving enough relief while using the spirometer.  You develop fever of 100.5 F (38.1 C) or higher. SEEK IMMEDIATE MEDICAL CARE IF:   You cough up bloody sputum that had not been present before.  You develop fever of 102 F (38.9 C) or greater.  You develop worsening pain at or near the incision site. MAKE SURE YOU:   Understand these instructions.  Will watch your condition.  Will get help right away if you are not doing well or get worse. Document Released: 03/03/2007 Document Revised: 01/13/2012 Document Reviewed: 05/04/2007 [facility] Patient Information 2014 [place], [place].   ________________________________________________________________________

## 2017-12-01 ENCOUNTER — Encounter (HOSPITAL_COMMUNITY)
Admission: RE | Admit: 2017-12-01 | Discharge: 2017-12-01 | Disposition: A | Discharge status: Home / Self Care | Payer: 59 | Source: Ambulatory Visit | Attending: Obstetrics and Gynecology | Admitting: Obstetrics and Gynecology

## 2017-12-01 ENCOUNTER — Other Ambulatory Visit: Payer: Self-pay | Admitting: Obstetrics and Gynecology

## 2017-12-01 ENCOUNTER — Encounter (HOSPITAL_COMMUNITY): Payer: Self-pay

## 2017-12-01 ENCOUNTER — Other Ambulatory Visit: Payer: Self-pay

## 2017-12-01 DIAGNOSIS — Z01818 Encounter for other preprocedural examination: Secondary | ICD-10-CM | POA: Insufficient documentation

## 2017-12-01 DIAGNOSIS — D259 Leiomyoma of uterus, unspecified: Secondary | ICD-10-CM | POA: Insufficient documentation

## 2017-12-01 HISTORY — DX: Unspecified asthma, uncomplicated: J45.909

## 2017-12-01 LAB — CBC
HEMATOCRIT: 36.7 % (ref 36.0–46.0)
HEMOGLOBIN: 11.9 g/dL — AB (ref 12.0–15.0)
MCH: 27.4 pg (ref 26.0–34.0)
MCHC: 32.4 g/dL (ref 30.0–36.0)
MCV: 84.6 fL (ref 78.0–100.0)
PLATELETS: 385 10*3/uL (ref 150–400)
RBC: 4.34 MIL/uL (ref 3.87–5.11)
RDW: 13.2 % (ref 11.5–15.5)
WBC: 7.4 10*3/uL (ref 4.0–10.5)

## 2017-12-01 LAB — PREGNANCY, URINE: Preg Test, Ur: NEGATIVE

## 2017-12-04 NOTE — Anesthesia Preprocedure Evaluation (Addendum)
Anesthesia Evaluation  Patient identified by MRN, date of birth, ID band Patient awake    Reviewed: Allergy & Precautions, NPO status , Patient's Chart, lab work & pertinent test results  Airway Mallampati: II  TM Distance: >3 FB Neck ROM: Full    Dental no notable dental hx. (+) Chipped,    Pulmonary asthma ,    Pulmonary exam normal breath sounds clear to auscultation       Cardiovascular negative cardio ROS Normal cardiovascular exam Rhythm:Regular Rate:Normal     Neuro/Psych  Headaches, PSYCHIATRIC DISORDERS Depression  Neuromuscular disease    GI/Hepatic negative GI ROS, Neg liver ROS,   Endo/Other  negative endocrine ROS  Renal/GU negative Renal ROS Bladder dysfunction   negative genitourinary   Musculoskeletal  (+) Fibromyalgia -  Abdominal   Peds  Hematology negative hematology ROS (+)   Anesthesia Other Findings   Reproductive/Obstetrics negative OB ROS                            Anesthesia Physical Anesthesia Plan  ASA: II  Anesthesia Plan: General   Post-op Pain Management:    Induction: Intravenous  PONV Risk Score and Plan: 4 or greater and Ondansetron, Dexamethasone, Midazolam, Scopolamine patch - Pre-op and Treatment may vary due to age or medical condition  Airway Management Planned: Oral ETT  Additional Equipment:   Intra-op Plan:   Post-operative Plan: Extubation in OR  Informed Consent: I have reviewed the patients History and Physical, chart, labs and discussed the procedure including the risks, benefits and alternatives for the proposed anesthesia with the patient or authorized representative who has indicated his/her understanding and acceptance.   Dental advisory given  Plan Discussed with: CRNA  Anesthesia Plan Comments:         Anesthesia Quick Evaluation

## 2017-12-05 ENCOUNTER — Ambulatory Visit (HOSPITAL_BASED_OUTPATIENT_CLINIC_OR_DEPARTMENT_OTHER): Payer: 59 | Admitting: Certified Registered"

## 2017-12-05 ENCOUNTER — Encounter (HOSPITAL_COMMUNITY): Payer: Self-pay | Admitting: *Deleted

## 2017-12-05 ENCOUNTER — Encounter (HOSPITAL_BASED_OUTPATIENT_CLINIC_OR_DEPARTMENT_OTHER)
Admission: RE | Disposition: A | Discharge status: Home / Self Care | Payer: Self-pay | Source: Ambulatory Visit | Attending: Obstetrics and Gynecology

## 2017-12-05 ENCOUNTER — Ambulatory Visit (HOSPITAL_COMMUNITY)
Admission: RE | Admit: 2017-12-05 | Discharge: 2017-12-06 | Disposition: A | Discharge status: Home / Self Care | Payer: 59 | Source: Ambulatory Visit | Attending: Obstetrics and Gynecology | Admitting: Obstetrics and Gynecology

## 2017-12-05 DIAGNOSIS — N800 Endometriosis of uterus: Secondary | ICD-10-CM | POA: Diagnosis not present

## 2017-12-05 DIAGNOSIS — D259 Leiomyoma of uterus, unspecified: Secondary | ICD-10-CM | POA: Insufficient documentation

## 2017-12-05 DIAGNOSIS — G43909 Migraine, unspecified, not intractable, without status migrainosus: Secondary | ICD-10-CM | POA: Diagnosis not present

## 2017-12-05 DIAGNOSIS — Z882 Allergy status to sulfonamides status: Secondary | ICD-10-CM | POA: Diagnosis not present

## 2017-12-05 DIAGNOSIS — J45909 Unspecified asthma, uncomplicated: Secondary | ICD-10-CM | POA: Insufficient documentation

## 2017-12-05 DIAGNOSIS — N838 Other noninflammatory disorders of ovary, fallopian tube and broad ligament: Secondary | ICD-10-CM | POA: Insufficient documentation

## 2017-12-05 DIAGNOSIS — N72 Inflammatory disease of cervix uteri: Secondary | ICD-10-CM | POA: Insufficient documentation

## 2017-12-05 DIAGNOSIS — Z79899 Other long term (current) drug therapy: Secondary | ICD-10-CM | POA: Insufficient documentation

## 2017-12-05 DIAGNOSIS — Z9889 Other specified postprocedural states: Secondary | ICD-10-CM

## 2017-12-05 DIAGNOSIS — M797 Fibromyalgia: Secondary | ICD-10-CM | POA: Insufficient documentation

## 2017-12-05 DIAGNOSIS — N801 Endometriosis of ovary: Secondary | ICD-10-CM | POA: Insufficient documentation

## 2017-12-05 DIAGNOSIS — F329 Major depressive disorder, single episode, unspecified: Secondary | ICD-10-CM | POA: Insufficient documentation

## 2017-12-05 DIAGNOSIS — G709 Myoneural disorder, unspecified: Secondary | ICD-10-CM | POA: Insufficient documentation

## 2017-12-05 DIAGNOSIS — Z6827 Body mass index (BMI) 27.0-27.9, adult: Secondary | ICD-10-CM | POA: Diagnosis not present

## 2017-12-05 DIAGNOSIS — N319 Neuromuscular dysfunction of bladder, unspecified: Secondary | ICD-10-CM | POA: Insufficient documentation

## 2017-12-05 DIAGNOSIS — E669 Obesity, unspecified: Secondary | ICD-10-CM | POA: Insufficient documentation

## 2017-12-05 HISTORY — PX: ROBOTIC ASSISTED LAPAROSCOPIC HYSTERECTOMY AND SALPINGECTOMY: SHX6379

## 2017-12-05 HISTORY — PX: CYSTOSCOPY: SHX5120

## 2017-12-05 SURGERY — XI ROBOTIC ASSISTED LAPAROSCOPIC HYSTERECTOMY AND SALPINGECTOMY
Anesthesia: General | Site: Urethra

## 2017-12-05 MED ORDER — PROPOFOL 10 MG/ML IV BOLUS
INTRAVENOUS | Status: DC | PRN
  Administered 2017-12-05: 200 mg via INTRAVENOUS

## 2017-12-05 MED ORDER — DEXAMETHASONE SODIUM PHOSPHATE 10 MG/ML IJ SOLN
INTRAMUSCULAR | Status: AC
  Filled 2017-12-05: qty 1

## 2017-12-05 MED ORDER — MIDAZOLAM HCL 2 MG/2ML IJ SOLN
2.0000 mg | INTRAMUSCULAR | Status: AC | PRN
  Administered 2017-12-05: 0.5 mg via INTRAVENOUS
  Administered 2017-12-05: 1 mg via INTRAVENOUS

## 2017-12-05 MED ORDER — LIP MEDEX EX OINT
TOPICAL_OINTMENT | CUTANEOUS | Status: AC
  Filled 2017-12-05: qty 7

## 2017-12-05 MED ORDER — OXYCODONE-ACETAMINOPHEN 5-325 MG PO TABS
ORAL_TABLET | ORAL | Status: AC
Start: 2017-12-05 — End: 2017-12-05
  Filled 2017-12-05: qty 1

## 2017-12-05 MED ORDER — CYCLOBENZAPRINE HCL 10 MG PO TABS
10.0000 mg | ORAL_TABLET | Freq: Three times a day (TID) | ORAL | Status: DC | PRN
  Administered 2017-12-06: 10 mg via ORAL
  Filled 2017-12-05 (×2): qty 1

## 2017-12-05 MED ORDER — OXYCODONE-ACETAMINOPHEN 5-325 MG PO TABS
ORAL_TABLET | ORAL | Status: AC
  Filled 2017-12-05: qty 1

## 2017-12-05 MED ORDER — TOPIRAMATE 100 MG PO TABS
100.0000 mg | ORAL_TABLET | Freq: Two times a day (BID) | ORAL | Status: DC
  Filled 2017-12-05: qty 1

## 2017-12-05 MED ORDER — ONDANSETRON HCL 4 MG PO TABS
4.0000 mg | ORAL_TABLET | Freq: Four times a day (QID) | ORAL | Status: DC | PRN
Start: 2017-12-05 — End: 2017-12-06
  Filled 2017-12-05: qty 1

## 2017-12-05 MED ORDER — HYDROMORPHONE HCL 1 MG/ML IJ SOLN
INTRAMUSCULAR | Status: AC
  Filled 2017-12-05: qty 1

## 2017-12-05 MED ORDER — ALPRAZOLAM 1 MG PO TABS
1.0000 mg | ORAL_TABLET | Freq: Four times a day (QID) | ORAL | Status: DC | PRN
  Filled 2017-12-05: qty 1

## 2017-12-05 MED ORDER — ONDANSETRON HCL 4 MG/2ML IJ SOLN
INTRAMUSCULAR | Status: DC | PRN
  Administered 2017-12-05: 4 mg via INTRAVENOUS

## 2017-12-05 MED ORDER — MEPERIDINE HCL 50 MG/ML IJ SOLN: 6.2500 mg | INTRAMUSCULAR | Status: DC | PRN

## 2017-12-05 MED ORDER — ONDANSETRON HCL 4 MG/2ML IJ SOLN
INTRAMUSCULAR | Status: AC
  Filled 2017-12-05: qty 2

## 2017-12-05 MED ORDER — SODIUM CHLORIDE 0.9 % IJ SOLN
INTRAMUSCULAR | Status: AC
  Filled 2017-12-05: qty 50

## 2017-12-05 MED ORDER — HYDROMORPHONE HCL 1 MG/ML IJ SOLN
0.2500 mg | INTRAMUSCULAR | Status: DC | PRN
Start: 2017-12-05 — End: 2017-12-05
  Administered 2017-12-05 (×2): 0.25 mg via INTRAVENOUS
  Administered 2017-12-05 (×3): 0.5 mg via INTRAVENOUS

## 2017-12-05 MED ORDER — SUGAMMADEX SODIUM 200 MG/2ML IV SOLN
INTRAVENOUS | Status: AC
  Filled 2017-12-05: qty 2

## 2017-12-05 MED ORDER — ROCURONIUM BROMIDE 10 MG/ML (PF) SYRINGE
PREFILLED_SYRINGE | INTRAVENOUS | Status: DC | PRN
  Administered 2017-12-05: 10 mg via INTRAVENOUS
  Administered 2017-12-05: 20 mg via INTRAVENOUS
  Administered 2017-12-05: 50 mg via INTRAVENOUS

## 2017-12-05 MED ORDER — FENTANYL CITRATE (PF) 100 MCG/2ML IJ SOLN
INTRAMUSCULAR | Status: DC | PRN
  Administered 2017-12-05: 50 ug via INTRAVENOUS
  Administered 2017-12-05: 100 ug via INTRAVENOUS
  Administered 2017-12-05 (×2): 50 ug via INTRAVENOUS
  Administered 2017-12-05: 100 ug via INTRAVENOUS

## 2017-12-05 MED ORDER — IBUPROFEN 200 MG PO TABS
ORAL_TABLET | ORAL | Status: AC
  Filled 2017-12-05: qty 4

## 2017-12-05 MED ORDER — LACTATED RINGERS IV SOLN
INTRAVENOUS | Status: DC
  Administered 2017-12-05: 07:00:00 via INTRAVENOUS

## 2017-12-05 MED ORDER — LIDOCAINE 2% (20 MG/ML) 5 ML SYRINGE
INTRAMUSCULAR | Status: DC | PRN
  Administered 2017-12-05: 60 mg via INTRAVENOUS

## 2017-12-05 MED ORDER — DEXAMETHASONE SODIUM PHOSPHATE 10 MG/ML IJ SOLN
INTRAMUSCULAR | Status: DC | PRN
  Administered 2017-12-05: 5 mg via INTRAVENOUS

## 2017-12-05 MED ORDER — RIZATRIPTAN BENZOATE 10 MG PO TBDP
10.0000 mg | ORAL_TABLET | ORAL | Status: DC | PRN
  Filled 2017-12-05: qty 1

## 2017-12-05 MED ORDER — ONDANSETRON HCL 4 MG/2ML IJ SOLN
4.0000 mg | Freq: Four times a day (QID) | INTRAMUSCULAR | Status: DC | PRN
  Filled 2017-12-05: qty 2

## 2017-12-05 MED ORDER — ALBUTEROL SULFATE (2.5 MG/3ML) 0.083% IN NEBU: 2.5000 mg | INHALATION_SOLUTION | RESPIRATORY_TRACT | Status: DC | PRN

## 2017-12-05 MED ORDER — KETOROLAC TROMETHAMINE 30 MG/ML IJ SOLN
INTRAMUSCULAR | Status: AC
  Filled 2017-12-05: qty 1

## 2017-12-05 MED ORDER — ROPIVACAINE HCL 5 MG/ML IJ SOLN
INTRAMUSCULAR | Status: DC | PRN
  Administered 2017-12-05: 60 mL

## 2017-12-05 MED ORDER — CEFAZOLIN SODIUM-DEXTROSE 2-4 GM/100ML-% IV SOLN
2.0000 g | INTRAVENOUS | Status: AC
  Administered 2017-12-05: 2 g via INTRAVENOUS
  Filled 2017-12-05: qty 100

## 2017-12-05 MED ORDER — BECLOMETHASONE DIPROPIONATE 80 MCG/ACT NA AERS
INHALATION_SPRAY | Freq: Every day | NASAL | Status: DC | PRN
  Filled 2017-12-05: qty 1

## 2017-12-05 MED ORDER — MIDAZOLAM HCL 2 MG/2ML IJ SOLN
INTRAMUSCULAR | Status: DC | PRN
  Administered 2017-12-05: 2 mg via INTRAVENOUS

## 2017-12-05 MED ORDER — MIDAZOLAM HCL 2 MG/2ML IJ SOLN
INTRAMUSCULAR | Status: AC
Start: 2017-12-05 — End: 2017-12-05
  Filled 2017-12-05: qty 2

## 2017-12-05 MED ORDER — KETOROLAC TROMETHAMINE 30 MG/ML IJ SOLN: 30.0000 mg | Freq: Four times a day (QID) | INTRAMUSCULAR | Status: DC

## 2017-12-05 MED ORDER — PROMETHAZINE HCL 25 MG/ML IJ SOLN: 6.2500 mg | INTRAMUSCULAR | Status: DC | PRN

## 2017-12-05 MED ORDER — PROPOFOL 10 MG/ML IV BOLUS
INTRAVENOUS | Status: AC
  Filled 2017-12-05: qty 20

## 2017-12-05 MED ORDER — FENTANYL CITRATE (PF) 250 MCG/5ML IJ SOLN
INTRAMUSCULAR | Status: AC
  Filled 2017-12-05: qty 5

## 2017-12-05 MED ORDER — LAMOTRIGINE 100 MG PO TABS
400.0000 mg | ORAL_TABLET | Freq: Every day | ORAL | Status: DC
  Filled 2017-12-05: qty 1

## 2017-12-05 MED ORDER — MIDAZOLAM HCL 2 MG/2ML IJ SOLN
INTRAMUSCULAR | Status: AC
  Filled 2017-12-05: qty 2

## 2017-12-05 MED ORDER — IBUPROFEN 800 MG PO TABS
800.0000 mg | ORAL_TABLET | Freq: Three times a day (TID) | ORAL | Status: DC | PRN
  Administered 2017-12-05 – 2017-12-06 (×2): 800 mg via ORAL
  Filled 2017-12-05 (×2): qty 1

## 2017-12-05 MED ORDER — ROCURONIUM BROMIDE 10 MG/ML (PF) SYRINGE
PREFILLED_SYRINGE | INTRAVENOUS | Status: AC
  Filled 2017-12-05: qty 5

## 2017-12-05 MED ORDER — SODIUM CHLORIDE 0.9 % IR SOLN
Status: DC | PRN
  Administered 2017-12-05: 3000 mL

## 2017-12-05 MED ORDER — LORATADINE 10 MG PO TABS
10.0000 mg | ORAL_TABLET | Freq: Every day | ORAL | Status: DC
  Filled 2017-12-05: qty 1

## 2017-12-05 MED ORDER — LIDOCAINE 2% (20 MG/ML) 5 ML SYRINGE
INTRAMUSCULAR | Status: AC
  Filled 2017-12-05: qty 5

## 2017-12-05 MED ORDER — KETOROLAC TROMETHAMINE 30 MG/ML IJ SOLN
INTRAMUSCULAR | Status: DC | PRN
  Administered 2017-12-05: 30 mg via INTRAVENOUS

## 2017-12-05 MED ORDER — OXYCODONE-ACETAMINOPHEN 5-325 MG PO TABS
1.0000 | ORAL_TABLET | ORAL | Status: DC | PRN
  Administered 2017-12-05 – 2017-12-06 (×5): 1 via ORAL
  Filled 2017-12-05 (×2): qty 1

## 2017-12-05 MED ORDER — ROPIVACAINE HCL 5 MG/ML IJ SOLN
INTRAMUSCULAR | Status: AC
  Filled 2017-12-05: qty 30

## 2017-12-05 MED ORDER — MONTELUKAST SODIUM 10 MG PO TABS
10.0000 mg | ORAL_TABLET | Freq: Every day | ORAL | Status: DC
  Filled 2017-12-05: qty 1

## 2017-12-05 MED ORDER — FENTANYL CITRATE (PF) 100 MCG/2ML IJ SOLN
INTRAMUSCULAR | Status: AC
  Filled 2017-12-05: qty 2

## 2017-12-05 MED ORDER — MENTHOL 3 MG MT LOZG
1.0000 | LOZENGE | OROMUCOSAL | Status: DC | PRN
  Filled 2017-12-05: qty 9

## 2017-12-05 SURGICAL SUPPLY — 59 items
ADH SKN CLS APL DERMABOND .7 (GAUZE/BANDAGES/DRESSINGS) ×2
BARRIER ADHS 3X4 INTERCEED (GAUZE/BANDAGES/DRESSINGS) IMPLANT
BRR ADH 4X3 ABS CNTRL BYND (GAUZE/BANDAGES/DRESSINGS)
CANISTER SUCT 3000ML PPV (MISCELLANEOUS) ×3 IMPLANT
CATH FOLEY 2WAY SLVR  5CC 16FR (CATHETERS) ×1
CATH FOLEY 2WAY SLVR 5CC 16FR (CATHETERS) ×2 IMPLANT
COVER BACK TABLE 60X90IN (DRAPES) ×3 IMPLANT
COVER TIP SHEARS 8 DVNC (MISCELLANEOUS) ×2 IMPLANT
COVER TIP SHEARS 8MM DA VINCI (MISCELLANEOUS) ×1
DECANTER SPIKE VIAL GLASS SM (MISCELLANEOUS) ×6 IMPLANT
DEFOGGER SCOPE WARMER CLEARIFY (MISCELLANEOUS) ×3 IMPLANT
DERMABOND ADVANCED (GAUZE/BANDAGES/DRESSINGS) ×1
DERMABOND ADVANCED .7 DNX12 (GAUZE/BANDAGES/DRESSINGS) ×2 IMPLANT
DRAPE ARM DVNC X/XI (DISPOSABLE) ×6 IMPLANT
DRAPE COLUMN DVNC XI (DISPOSABLE) ×2 IMPLANT
DRAPE DA VINCI XI ARM (DISPOSABLE) ×3
DRAPE DA VINCI XI COLUMN (DISPOSABLE) ×1
DURAPREP 26ML APPLICATOR (WOUND CARE) ×3 IMPLANT
ELECT REM PT RETURN 9FT ADLT (ELECTROSURGICAL) ×3
ELECTRODE REM PT RTRN 9FT ADLT (ELECTROSURGICAL) ×2 IMPLANT
GLOVE BIO SURGEON STRL SZ7 (GLOVE) ×9 IMPLANT
GLOVE BIOGEL PI IND STRL 7.0 (GLOVE) ×4 IMPLANT
GLOVE BIOGEL PI INDICATOR 7.0 (GLOVE) ×2
IRRIG SUCT STRYKERFLOW 2 WTIP (MISCELLANEOUS) ×3
IRRIGATION SUCT STRKRFLW 2 WTP (MISCELLANEOUS) ×2 IMPLANT
LEGGING LITHOTOMY PAIR STRL (DRAPES) ×3 IMPLANT
MANIPULATOR ADVINCU DEL 2.5 PL (MISCELLANEOUS) ×1 IMPLANT
MANIPULATOR ADVINCU DEL 3.0 PL (MISCELLANEOUS) IMPLANT
MANIPULATOR ADVINCU DEL 3.5 PL (MISCELLANEOUS) IMPLANT
MANIPULATOR ADVINCU DEL 4.0 PL (MISCELLANEOUS) IMPLANT
NEEDLE INSUFFLATION 120MM (ENDOMECHANICALS) ×3 IMPLANT
OBTURATOR OPTICAL STANDARD 8MM (TROCAR) ×1
OBTURATOR OPTICAL STND 8 DVNC (TROCAR) ×2
OBTURATOR OPTICALSTD 8 DVNC (TROCAR) ×2 IMPLANT
OCCLUDER COLPOPNEUMO (BALLOONS) ×1 IMPLANT
PACK ROBOT WH (CUSTOM PROCEDURE TRAY) ×3 IMPLANT
PACK ROBOTIC GOWN (GOWN DISPOSABLE) ×3 IMPLANT
PACK TRENDGUARD 450 HYBRID PRO (MISCELLANEOUS) IMPLANT
PACK TRENDGUARD 600 HYBRD PROC (MISCELLANEOUS) IMPLANT
PAD PREP 24X48 CUFFED NSTRL (MISCELLANEOUS) ×3 IMPLANT
POSITIONER SURGICAL ARM (MISCELLANEOUS) ×2 IMPLANT
POUCH LAPAROSCOPIC INSTRUMENT (MISCELLANEOUS) ×1 IMPLANT
SEAL CANN UNIV 5-8 DVNC XI (MISCELLANEOUS) ×6 IMPLANT
SEAL XI 5MM-8MM UNIVERSAL (MISCELLANEOUS) ×3
SET CYSTO W/LG BORE CLAMP LF (SET/KITS/TRAYS/PACK) ×3 IMPLANT
SET TRI-LUMEN FLTR TB AIRSEAL (TUBING) ×3 IMPLANT
SUT DVC VLOC 180 0 12IN GS21 (SUTURE)
SUT VIC AB 0 CT1 36 (SUTURE) ×1 IMPLANT
SUT VIC AB 2-0 CT2 27 (SUTURE) ×6 IMPLANT
SUT VIC AB 2-0 UR6 27 (SUTURE) ×3 IMPLANT
SUT VICRYL RAPIDE 3 0 (SUTURE) ×6 IMPLANT
SUT VLOC 180 0 9IN  GS21 (SUTURE) ×1
SUT VLOC 180 0 9IN GS21 (SUTURE) ×2 IMPLANT
SUTURE DVC VLC 180 0 12IN GS21 (SUTURE) IMPLANT
TOWEL OR 17X24 6PK STRL BLUE (TOWEL DISPOSABLE) ×9 IMPLANT
TRENDGUARD 450 HYBRID PRO PACK (MISCELLANEOUS) ×3
TRENDGUARD 600 HYBRID PROC PK (MISCELLANEOUS)
TROCAR PORT AIRSEAL 5X120 (TROCAR) ×3 IMPLANT
WATER STERILE IRR 1000ML POUR (IV SOLUTION) ×3 IMPLANT

## 2017-12-05 NOTE — Anesthesia Postprocedure Evaluation (Signed)
Anesthesia Post Note  Patient: Barbara Travis  Procedure(s) Performed: XI ROBOTIC ASSISTED LAPAROSCOPIC HYSTERECTOMY AND Bilateral SALPINGECTOMY, Left OOphrectomy,  Peritoneal Biopsy Left (N/A Abdomen) CYSTOSCOPY (N/A Urethra)     Patient location during evaluation: PACU Anesthesia Type: General Level of consciousness: sedated and patient cooperative Pain management: pain level controlled Vital Signs Assessment: post-procedure vital signs reviewed and stable Respiratory status: spontaneous breathing Cardiovascular status: stable Anesthetic complications: no    Last Vitals:  Vitals:   12/05/17 1435 12/05/17 1901  BP: 120/70 108/61  Pulse: 89   Resp: 16 16  Temp: 36.9 C 37 C  SpO2: 100% 100%    Last Pain:  Vitals:   12/05/17 2105  TempSrc:   PainSc: [place]

## 2017-12-05 NOTE — Anesthesia Procedure Notes (Signed)
Procedure Name: Intubation Date/Time: 12/05/2017 7:30 AM Performed by: Jonna Munro, CRNA Pre-anesthesia Checklist: Patient identified, Emergency Drugs available, Suction available, Patient being monitored and Timeout performed Patient Re-evaluated:Patient Re-evaluated prior to induction Oxygen Delivery Method: Circle system utilized Preoxygenation: Pre-oxygenation with 100% oxygen Induction Type: IV induction Ventilation: Mask ventilation without difficulty Laryngoscope Size: Mac and 3 Grade View: Grade I Tube type: Oral Tube size: 7.0 mm Number of attempts: 1 Airway Equipment and Method: Stylet Placement Confirmation: ETT inserted through vocal cords under direct vision,  positive ETCO2 and breath sounds checked- equal and bilateral Secured at: 22 cm Tube secured with: Tape Dental Injury: Teeth and Oropharynx as per pre-operative assessment

## 2017-12-05 NOTE — Transfer of Care (Signed)
Immediate Anesthesia Transfer of Care Note  Patient: Barbara Travis  Procedure(s) Performed: XI ROBOTIC ASSISTED LAPAROSCOPIC HYSTERECTOMY AND Bilateral SALPINGECTOMY, Left OOphrectomy,  Peritoneal Biopsy Left (N/A Abdomen) CYSTOSCOPY (N/A Urethra)  Patient Location: PACU  Anesthesia Type:General  Level of Consciousness: awake, alert  and oriented  Airway & Oxygen Therapy: Patient Spontanous Breathing and Patient connected to nasal cannula oxygen  Post-op Assessment: Report given to RN, Post -op Vital signs reviewed and stable and Patient moving all extremities X 4  Post vital signs: Reviewed and stable  Last Vitals:  Vitals:   12/05/17 0545  BP: 118/85  Resp: 16  Temp: 36.6 C  SpO2: 100%    Last Pain:  Vitals:   12/05/17 0603  TempSrc:   PainSc: 4          Complications: No apparent anesthesia complications

## 2017-12-05 NOTE — Op Note (Signed)
12/05/2017  9:36 AM  PATIENT:  Barbara Travis  42 y.o. female  PRE-OPERATIVE DIAGNOSIS:  FIBROIDS  POST-OPERATIVE DIAGNOSIS:  FIBROIDS and endometriosis  PROCEDURE:  Procedure(s): XI ROBOTIC ASSISTED LAPAROSCOPIC HYSTERECTOMY AND Bilateral SALPINGECTOMY, Left OOphrectomy,  Peritoneal Biopsy Left (N/A) CYSTOSCOPY (N/A)  SURGEON:  Surgeon(s) and Role:    Bobbye Charleston, MD - Primary    * Allyn Kenner, DO - Assisting  ANESTHESIA:   general  EBL:  100 mL   LOCAL MEDICATIONS USED:  OTHER Ropivicaine  SPECIMEN:  Source of Specimen:  Uterus, Left Ovary, cervix, bilateral tubes.  DISPOSITION OF SPECIMEN:  PATHOLOGY  COUNTS:  YES  TOURNIQUET:  * No tourniquets in log *  DICTATION: .Note written in [place]: Admit for overnight observation  PATIENT DISPOSITION:  PACU - hemodynamically stable.   Delay start of Pharmacological VTE agent (>24hrs) due to surgical blood loss or risk of bleeding: not applicable  Complications:  None.  Findings:  13 weeks size uterus with large fibroid posterior.  R ovary was normal.  L ovary had small endometriotic cysts with surface endometriosis.  The ureters were identified during multiple points of the case and were always out of the field of dissection.  On cystoscopy, the bladder was intact and bilateral spill was seen from each ureteral orriface.    Medications:  Ancef.  Ropivicaine.    Technique:  After adequate anesthesia was achieved the patient was positioned, prepped and draped in usual sterile fashion.  A speculum was placed in the vagina and the cervix dilated with pratt dilators.  The Advincula 2.5 cm Koh ring was assembled and placed in proper fashion.  The  Speculum was removed and the bladder catheterized with a foley.    Attention was turned to the abdomen where a 1 cm incision was made 1 cm above the umbilicus.  The veress needle was introduced without aspiration of bowel contents or blood and the abdomen  insufflated. The 8.5 mm trocar was placed and the other three trocar sites were marked out, all approximately 10 cm from each other and the camera.  Two 8.5 mm trocars were placed on either side of the camera port and a 5 mm assistant port was placed 3 cm above the plane of the other trocars.  All trocars were inserted under direct visualization of the camera.  The patient was placed in trendelenburg and then the Robot docked.  The fenestrated bipolar forceps were placed on arm 3 and the Hot shears on arm 1 and introduced under direct visualization of the camera.  I then broke scrub and sat down at the console.  The above findings were noted and the ureters identified well out of the field of dissection.  The right fallopian tube was isolated and cauterized with the bipolar.  The Utero-ovarian ligament was then divided with the bipolar cautery and shears.  The posterior broad ligament was then divided with the hot shears until the uterosacral ligament.  The Broad and cardinal ligaments were then cauterized against the cervix to the level of the Koh ring, securing the uterine artery.  Each pedicle was then incised with the shears.  The anterior leaf was then incised at the reflection of the vessico-uterine junction and the lateral bladder retracted inferiorly after the round ligament had been divided with the bipolar forceps.  The above findings were noted and it was decided to remove the L ovary for that pathology.  The left IP ligament was cauterized  with the bipolar and divided with the shears.  The round ligament was divided as well and the posterior leaf of the broad ligament then divided with the hot shears. The broad and cardinal ligaments were then cauterized on the left in the same way.   At the level of the internal os, the uterine arteries were bilaterally cauterized with the bipolar.  The ureters were identified well out of the field of dissection.  There was a 5 mm spot of endometriosis on the L  peritoneum just below the ureter on the L side.  The peritoneum was peeled away from the ureter and the small implant was excised with the hot shears and sent to path.  The ureter was well away from the field of dissection.   The bladder was then able to be retracted inferiorly and the vesico-uterine fascia was incised in the midline until the bladder was removed one cm below the Koh ring.  The hot shears then circumferentially incised the vagina at the level of the reflection on the [facility] ring.  Once the uterus and cervix were amputated, cautery was used to insure hemostasis of the cuff.  Once hemostasis was achieved, the instruments were changed to the mega needle driver and mega suture cut needle driver and the cuff was closed with a running stitches of 0-vicryl V loc.  The ureters were peristalsing bilaterally well and very lateral to the areas of operation.    The Robot was then undocked and I scrubbed back in.  The needle was removed and Bupivicaine was introduced into the pelvis.  The middle 8.5 mm trocar site was closed with a figure of 8 stitch of 0 vicryl.  The skin incisions were closed with subcuticular stitches of 3-0 vicryl Rapide and Dermabond.  All instruments were removed from the vagina and cystoscopy performed, revealing an intact bladder and vigourous spill of indigo carmine from each ureteral orifice.  The cystoscope was removed.  The cuff was checked and there was small area on the L that was open- a figure of eight stitch was placed vaginally and only deep enough to help close.   The instruments were removed from the vagina and the patient taken to the recovery room in stable condition.  Barbara Travis A

## 2017-12-05 NOTE — Progress Notes (Signed)
There has been no change in the patients history, status or exam since the history and physical.  Vitals:   12/05/17 0545 12/05/17 0603  BP: 118/85   Resp: 16   Temp: 97.8 F (36.6 C)   TempSrc: Oral   SpO2: 100%   Weight:  168 lb (76.2 kg)  Height:  5\' 6"  (1.676 m)    No results found for this or any previous visit (from the past 72 hour(s)).  Evoleht Hovatter A

## 2017-12-05 NOTE — Brief Op Note (Signed)
12/05/2017  9:36 AM  PATIENT:  Barbara Travis  42 y.o. female  PRE-OPERATIVE DIAGNOSIS:  FIBROIDS  POST-OPERATIVE DIAGNOSIS:  FIBROIDS and endometriosis  PROCEDURE:  Procedure(s): XI ROBOTIC ASSISTED LAPAROSCOPIC HYSTERECTOMY AND Bilateral SALPINGECTOMY, Left OOphrectomy,  Peritoneal Biopsy Left (N/A) CYSTOSCOPY (N/A)  SURGEON:  Surgeon(s) and Role:    Bobbye Charleston, MD - Primary    * Allyn Kenner, DO - Assisting  ANESTHESIA:   general  EBL:  100 mL   LOCAL MEDICATIONS USED:  OTHER Ropivicaine  SPECIMEN:  Source of Specimen:  Uterus, Left Ovary, cervix, bilateral tubes.  DISPOSITION OF SPECIMEN:  PATHOLOGY  COUNTS:  YES  TOURNIQUET:  * No tourniquets in log *  DICTATION: .Note written in [place]: Admit for overnight observation  PATIENT DISPOSITION:  PACU - hemodynamically stable.   Delay start of Pharmacological VTE agent (>24hrs) due to surgical blood loss or risk of bleeding: not applicable  Complications:  None.  Findings:  13 weeks size uterus with large fibroid posterior.  R ovary was normal.  L ovary had small endometriotic cysts with surface endometriosis.  The ureters were identified during multiple points of the case and were always out of the field of dissection.  On cystoscopy, the bladder was intact and bilateral spill was seen from each ureteral orriface.    Medications:  Ancef.  Ropivicaine.    Technique:  After adequate anesthesia was achieved the patient was positioned, prepped and draped in usual sterile fashion.  A speculum was placed in the vagina and the cervix dilated with pratt dilators.  The Advincula 2.5 cm Koh ring was assembled and placed in proper fashion.  The  Speculum was removed and the bladder catheterized with a foley.    Attention was turned to the abdomen where a 1 cm incision was made 1 cm above the umbilicus.  The veress needle was introduced without aspiration of bowel contents or blood and the abdomen  insufflated. The 8.5 mm trocar was placed and the other three trocar sites were marked out, all approximately 10 cm from each other and the camera.  Two 8.5 mm trocars were placed on either side of the camera port and a 5 mm assistant port was placed 3 cm above the plane of the other trocars.  All trocars were inserted under direct visualization of the camera.  The patient was placed in trendelenburg and then the Robot docked.  The fenestrated bipolar forceps were placed on arm 3 and the Hot shears on arm 1 and introduced under direct visualization of the camera.  I then broke scrub and sat down at the console.  The above findings were noted and the ureters identified well out of the field of dissection.  The right fallopian tube was isolated and cauterized with the bipolar.  The Utero-ovarian ligament was then divided with the bipolar cautery and shears.  The posterior broad ligament was then divided with the hot shears until the uterosacral ligament.  The Broad and cardinal ligaments were then cauterized against the cervix to the level of the Koh ring, securing the uterine artery.  Each pedicle was then incised with the shears.  The anterior leaf was then incised at the reflection of the vessico-uterine junction and the lateral bladder retracted inferiorly after the round ligament had been divided with the bipolar forceps.  The above findings were noted and it was decided to remove the L ovary for that pathology.  The left IP ligament was cauterized  with the bipolar and divided with the shears.  The round ligament was divided as well and the posterior leaf of the broad ligament then divided with the hot shears. The broad and cardinal ligaments were then cauterized on the left in the same way.   At the level of the internal os, the uterine arteries were bilaterally cauterized with the bipolar.  The ureters were identified well out of the field of dissection.  There was a 5 mm spot of endometriosis on the L  peritoneum just below the ureter on the L side.  The peritoneum was peeled away from the ureter and the small implant was excised with the hot shears and sent to path.  The ureter was well away from the field of dissection.   The bladder was then able to be retracted inferiorly and the vesico-uterine fascia was incised in the midline until the bladder was removed one cm below the Koh ring.  The hot shears then circumferentially incised the vagina at the level of the reflection on the [facility] ring.  Once the uterus and cervix were amputated, cautery was used to insure hemostasis of the cuff.  Once hemostasis was achieved, the instruments were changed to the mega needle driver and mega suture cut needle driver and the cuff was closed with a running stitches of 0-vicryl V loc.  The ureters were peristalsing bilaterally well and very lateral to the areas of operation.    The Robot was then undocked and I scrubbed back in.  The needle was removed and Bupivicaine was introduced into the pelvis.  The middle 8.5 mm trocar site was closed with a figure of 8 stitch of 0 vicryl.  The skin incisions were closed with subcuticular stitches of 3-0 vicryl Rapide and Dermabond.  All instruments were removed from the vagina and cystoscopy performed, revealing an intact bladder and vigourous spill of indigo carmine from each ureteral orifice.  The cystoscope was removed.  The cuff was checked and there was small area on the L that was open- a figure of eight stitch was placed vaginally and only deep enough to help close.   The instruments were removed from the vagina and the patient taken to the recovery room in stable condition.  Barbara Travis A

## 2017-12-05 NOTE — Progress Notes (Signed)
recived via stretcher into RCC#1.  Pt alert oriented. . Pt transferred w minimal assistance to bed. VSS. Oriented to unit and room.  Call bell within reach. Mesh pants applied.

## 2017-12-06 ENCOUNTER — Encounter (HOSPITAL_COMMUNITY): Payer: Self-pay | Admitting: Obstetrics and Gynecology

## 2017-12-06 DIAGNOSIS — N800 Endometriosis of uterus: Secondary | ICD-10-CM | POA: Diagnosis not present

## 2017-12-06 MED ORDER — IBUPROFEN 200 MG PO TABS
ORAL_TABLET | ORAL | Status: AC
Start: 2017-12-06 — End: 2017-12-06
  Filled 2017-12-06: qty 1

## 2017-12-06 MED ORDER — OXYCODONE-ACETAMINOPHEN 5-325 MG PO TABS
ORAL_TABLET | ORAL | Status: AC
  Filled 2017-12-06: qty 1

## 2017-12-06 MED ORDER — STERILE WATER FOR IRRIGATION IR SOLN
Status: DC | PRN
  Administered 2017-12-05: 1000 mL via INTRAVESICAL

## 2017-12-06 MED ORDER — OXYCODONE-ACETAMINOPHEN 5-325 MG PO TABS: 1.0000 | ORAL_TABLET | ORAL | 0 refills | Status: AC | PRN

## 2017-12-06 MED ORDER — IBUPROFEN 200 MG PO TABS
ORAL_TABLET | ORAL | Status: AC
  Filled 2017-12-06: qty 3

## 2017-12-06 NOTE — Discharge Summary (Signed)
Physician Discharge Summary  Patient ID: Barbara Travis MRN: [id number] DOB/AGE: May 22, 1976 42 y.o.  Admit date: 12/05/2017 Discharge date: 12/06/2017  Admission Diagnoses:symptomatic fibroid uterus  Discharge Diagnoses: same plus endometriosis Active Problems:   Postoperative state   Discharged Condition: good  Hospital Course:  Uncomplicated TLH/LSO/salpingectomy/resection of endometriosis/cystoscopy  Consults: None  Significant Diagnostic Studies: none  Treatments: surgery:  Uncomplicated TLH/LSO/salpingectomy/resection of endometriosis/cystoscopy  Discharge Exam: Blood pressure 107/64, pulse 75, temperature 97.9 F (36.6 C), temperature source Oral, resp. rate 16, height 5\' 6"  (1.676 m), weight 168 lb (76.2 kg), SpO2 100 %.   Disposition:   Discharge Instructions    Call MD for:  temperature >100.4   Complete by:  As directed    Diet - low sodium heart healthy   Complete by:  As directed    Discharge instructions   Complete by:  As directed    No driving on narcotics, no sexual activity for 2 weeks.   Increase activity slowly   Complete by:  As directed    May shower / Bathe   Complete by:  As directed    Shower, no bath for 2 weeks.   Remove dressing in 24 hours   Complete by:  As directed    Sexual Activity Restrictions   Complete by:  As directed    No sexual activity for 2 weeks.     Allergies as of 12/06/2017      Reactions   Sulfa Antibiotics    Med caused pt to vomit when she was younger      Medication List    STOP taking these medications   traMADol 50 MG tablet Commonly known as:  ULTRAM   tranexamic acid 650 MG Tabs tablet Commonly known as:  LYSTEDA     TAKE these medications   albuterol 108 (90 Base) MCG/ACT inhaler Commonly known as:  PROVENTIL HFA;VENTOLIN HFA Inhale into the lungs as needed for wheezing or shortness of breath.   ALPRAZolam 1 MG tablet Commonly known as:  XANAX Take 1 mg by mouth 4 (four) times daily as needed for  anxiety.   amphetamine-dextroamphetamine 30 MG tablet Commonly known as:  ADDERALL Take 30 mg by mouth 2 (two) times daily.   cetirizine 10 MG tablet Commonly known as:  ZYRTEC Take 10 mg by mouth daily.   cyclobenzaprine 10 MG tablet Commonly known as:  FLEXERIL Take 10 mg by mouth 3 (three) times daily as needed for muscle spasms.   fexofenadine 30 MG disintegrating tablet Commonly known as:  ALLEGRA ODT Take 30 mg by mouth daily.   hydrOXYzine 25 MG tablet Commonly known as:  ATARAX/VISTARIL Take 25 mg by mouth 3 (three) times daily as needed for itching (allergies).   lamoTRIgine 200 MG tablet Commonly known as:  LAMICTAL Take 400 mg by mouth daily.   montelukast 10 MG tablet Commonly known as:  SINGULAIR Take 10 mg by mouth daily.   naproxen sodium 220 MG tablet Commonly known as:  ALEVE Take 440 mg by mouth daily as needed (headache/pain).   oxyCODONE-acetaminophen 5-325 MG tablet Commonly known as:  PERCOCET/ROXICET Take 1 tablet by mouth every 4 (four) hours as needed for moderate pain ((when tolerating fluids)).   phenylephrine 10 MG Tabs tablet Commonly known as:  SUDAFED PE Take 10 mg by mouth every 4 (four) hours as needed.   QNASL NA Place 1 spray into the nose daily as needed (allergies).   rizatriptan 10 MG disintegrating tablet Commonly known as:  MAXALT-MLT Take 1 tablet (10 mg total) by mouth as needed for migraine. May repeat in 2 hours if needed   topiramate 100 MG tablet Commonly known as:  TOPAMAX Take 1 tablet (100 mg total) by mouth 2 (two) times daily.      Follow-up Information    Bobbye Charleston, MD Follow up in 2 week(s).   Specialty:  Obstetrics and Gynecology Contact information: [address] [place] [address] [phone]           Signed: Kameo Bains A 12/06/2017, 8:33 AM

## 2017-12-06 NOTE — Progress Notes (Signed)
Patient is eating, ambulating, and voiding.  Pain control is good.  Vitals:   12/05/17 1901 12/05/17 2300 12/06/17 0300 12/06/17 0700  BP: 108/61 115/72 112/74 107/64  Pulse:  84 72 75  Resp: 16 16 16 16   Temp: 98.6 F (37 C) 98.6 F (37 C) 98.3 F (36.8 C) 97.9 F (36.6 C)  TempSrc: Oral Oral Oral Oral  SpO2: 100% 100% 100% 100%  Weight:      Height:        lungs:   clear to auscultation cor:    RRR Abdomen:  soft, appropriate tenderness, incisions intact and without erythema or exudate. ex:    no cords   Lab Results  Component Value Date   WBC 7.4 12/01/2017   HGB 11.9 (L) 12/01/2017   HCT 36.7 12/01/2017   MCV 84.6 12/01/2017   PLT 385 12/01/2017    A/P  Routine care.  Expect d/c per plan.

## 2017-12-30 ENCOUNTER — Other Ambulatory Visit: Payer: Self-pay | Admitting: Obstetrics and Gynecology

## 2017-12-30 DIAGNOSIS — G8918 Other acute postprocedural pain: Secondary | ICD-10-CM

## 2018-01-07 ENCOUNTER — Ambulatory Visit (HOSPITAL_COMMUNITY)
Admission: RE | Admit: 2018-01-07 | Discharge: 2018-01-07 | Disposition: A | Discharge status: Home / Self Care | Payer: 59 | Source: Ambulatory Visit | Attending: Obstetrics and Gynecology | Admitting: Obstetrics and Gynecology

## 2018-01-07 ENCOUNTER — Other Ambulatory Visit (HOSPITAL_COMMUNITY): Payer: Self-pay | Admitting: Obstetrics and Gynecology

## 2018-01-07 DIAGNOSIS — Z90721 Acquired absence of ovaries, unilateral: Secondary | ICD-10-CM | POA: Insufficient documentation

## 2018-01-07 DIAGNOSIS — G8918 Other acute postprocedural pain: Secondary | ICD-10-CM

## 2018-01-07 DIAGNOSIS — N1330 Unspecified hydronephrosis: Secondary | ICD-10-CM | POA: Insufficient documentation

## 2018-01-07 DIAGNOSIS — Z90710 Acquired absence of both cervix and uterus: Secondary | ICD-10-CM | POA: Insufficient documentation

## 2018-01-07 DIAGNOSIS — N83201 Unspecified ovarian cyst, right side: Secondary | ICD-10-CM | POA: Diagnosis not present

## 2018-01-12 ENCOUNTER — Other Ambulatory Visit: Payer: 59

## 2018-01-23 ENCOUNTER — Encounter: Payer: Self-pay | Admitting: Radiology

## 2018-01-24 ENCOUNTER — Ambulatory Visit
Admission: RE | Admit: 2018-01-24 | Discharge: 2018-01-24 | Disposition: A | Discharge status: Home / Self Care | Payer: 59 | Source: Ambulatory Visit | Attending: Obstetrics and Gynecology | Admitting: Obstetrics and Gynecology

## 2018-01-24 DIAGNOSIS — G8918 Other acute postprocedural pain: Secondary | ICD-10-CM

## 2018-01-24 MED ORDER — IOPAMIDOL (ISOVUE-300) INJECTION 61%
100.0000 mL | Freq: Once | INTRAVENOUS | Status: AC | PRN
  Administered 2018-01-24: 100 mL via INTRAVENOUS

## 2018-04-16 ENCOUNTER — Ambulatory Visit: Payer: 59 | Attending: Obstetrics and Gynecology | Admitting: Physical Therapy

## 2018-04-16 ENCOUNTER — Other Ambulatory Visit: Payer: Self-pay

## 2018-04-16 ENCOUNTER — Encounter: Payer: Self-pay | Admitting: Physical Therapy

## 2018-04-16 DIAGNOSIS — R252 Cramp and spasm: Secondary | ICD-10-CM

## 2018-04-16 DIAGNOSIS — R279 Unspecified lack of coordination: Secondary | ICD-10-CM | POA: Diagnosis present

## 2018-04-16 DIAGNOSIS — M6281 Muscle weakness (generalized): Secondary | ICD-10-CM | POA: Diagnosis present

## 2018-04-16 NOTE — Patient Instructions (Signed)
Toileting Techniques for Bowel Movements (Defecation) Using your belly (abdomen) and pelvic floor muscles to have a bowel movement is usually instinctive.  Sometimes people can have problems with these muscles and have to relearn proper defecation (emptying) techniques.  If you have weakness in your muscles, organs that are falling out, decreased sensation in your pelvis, or ignore your urge to go, you may find yourself straining to have a bowel movement.  You are straining if you are: . holding your breath or taking in a huge gulp of air and holding it  . keeping your lips and jaw tensed and closed tightly . turning red in the face because of excessive pushing or forcing . developing or worsening your  hemorrhoids . getting faint while pushing . not emptying completely and have to defecate many times a day  If you are straining, you are actually making it harder for yourself to have a bowel movement.  Many people find they are pulling up with the pelvic floor muscles and closing off instead of opening the anus. Due to lack pelvic floor relaxation and coordination the abdominal muscles, one has to work harder to push the feces out.  Many people have never been taught how to defecate efficiently and effectively.  Notice what happens to your body when you are having a bowel movement.  While you are sitting on the toilet pay attention to the following areas: . Jaw and mouth position . Angle of your hips   . Whether your feet touch the ground or not . Arm placement  . Spine position . Waist . Belly tension . Anus (opening of the anal canal)  An Evacuation/Defecation Plan   Here are the 4 basic points:  1. Lean forward enough for your elbows to rest on your knees 2. Support your feet on the floor or use a low stool if your feet don't touch the floor  3. Push out your belly as if you have swallowed a beach ball-you should feel a widening of your waist 4. Open and relax your pelvic floor muscles,  rather than tightening around the anus      The following conditions my require modifications to your toileting posture:  . If you have had surgery in the past that limits your back, hip, pelvic, knee or ankle flexibility . Constipation   Your healthcare practitioner may make the following additional suggestions and adjustments:  1) Sit on the toilet  a) Make sure your feet are supported. b) Notice your hip angle and spine position-most people find it effective to lean forward or raise their knees, which can help the muscles around the anus to relax  c) When you lean forward, place your forearms on your thighs for support  2) Relax suggestions a) Breath deeply in through your nose and out slowly through your mouth as if you are smelling the flowers and blowing out the candles. b) To become aware of how to relax your muscles, contracting and releasing muscles can be helpful.  Pull your pelvic floor muscles in tightly by using the image of holding back gas, or closing around the anus (visualize making a circle smaller) and lifting the anus up and in.  Then release the muscles and your anus should drop down and feel open. Repeat 5 times ending with the feeling of relaxation. c) Keep your pelvic floor muscles relaxed; let your belly bulge out. d) The digestive tract starts at the mouth and ends at the anal opening, so be   sure to relax both ends of the tube.  Place your tongue on the roof of your mouth with your teeth separated.  This helps relax your mouth and will help to relax the anus at the same time.  3) Empty (defecation) a) Keep your pelvic floor and sphincter relaxed, then bulge your anal muscles.  Make the anal opening wide.  b) Stick your belly out as if you have swallowed a beach ball. c) Make your belly wall hard using your belly muscles while continuing to breathe. Doing this makes it easier to open your anus. d) Breath out and give a grunt (or try using other sounds such as  ahhhh, shhhhh, ohhhh or grrrrrrr).  4) Finish a) As you finish your bowel movement, pull the pelvic floor muscles up and in.  This will leave your anus in the proper place rather than remaining pushed out and down. If you leave your anus pushed out and down, it will start to feel as though that is normal and give you incorrect signals about needing to have a bowel movement.    Cheryl Gray, PT [facility] [address] [place], [place] [address] 

## 2018-04-16 NOTE — Therapy (Signed)
[facility] [address], [place], [place], [address] Phone: [phone]   Fax:  [phone]  Physical Therapy Evaluation  Patient Details  Name: Barbara Travis MRN: [id number] Date of Birth: 1976-10-21 Referring Provider: Dr. Bobbye Charleston   Encounter Date: 04/16/2018  PT End of Session - 04/16/18 0941    Visit Number  1    Date for PT Re-Evaluation  06/11/18    Authorization Type  [employer]    Authorization - Visit Number  1    Authorization - Number of Visits  20    PT Start Time  0845    PT Stop Time  0930    PT Time Calculation (min)  45 min    Activity Tolerance  Treatment limited secondary to medical complications (Comment)    Behavior During Therapy  [facility] for tasks assessed/performed       Past Medical History:  Diagnosis Date  . Asthma    allergy induced asthma  . Depression   . Fibromyalgia   . Migraine without aura, with intractable migraine, so stated, without mention of status migrainosus 03/24/2014    Past Surgical History:  Procedure Laterality Date  . ABDOMINAL HYSTERECTOMY     12-05-17 Dr. Philis Pique  . CYSTOSCOPY N/A 12/05/2017   Procedure: CYSTOSCOPY;  Surgeon: Bobbye Charleston, MD;  Location: [facility];  Service: Gynecology;  Laterality: N/A;  . DILATION AND CURETTAGE OF UTERUS    . ROBOTIC ASSISTED LAPAROSCOPIC HYSTERECTOMY AND SALPINGECTOMY N/A 12/05/2017   Procedure: XI ROBOTIC ASSISTED LAPAROSCOPIC HYSTERECTOMY AND Bilateral SALPINGECTOMY, Left OOphrectomy,  Peritoneal Biopsy Left;  Surgeon: Bobbye Charleston, MD;  Location: [facility];  Service: Gynecology;  Laterality: N/A;    There were no vitals filed for this visit.   Subjective Assessment - 04/16/18 0846    Subjective  Suprapubic area has a sharp pain after she laughs, sneezes or coughs.  MD said I may have adhesions.  Frequent urination. Unable to make it through a movie due to having to urinate.     Patient Stated Goals  urinate every 2-3  hours, reduce pain when cough and laugh or sneeze    Currently in Pain?  Yes    Pain Score  5     Pain Location  Abdomen    Pain Orientation  Lower;Mid    Pain Descriptors / Indicators  Cramping;Stabbing    Pain Type  Acute pain    Pain Onset  More than a month ago    Pain Frequency  Intermittent    Aggravating Factors   cough, sneeze, laugh    Pain Relieving Factors  not cough, sneeze, laugh    Multiple Pain Sites  No         [facility] PT Assessment - 04/16/18 0001      Assessment   Medical Diagnosis  G89.18 Other acute postprocedural pain    Referring Provider  Dr. Bobbye Charleston    Onset Date/Surgical Date  12/05/17    Prior Therapy  None      Precautions   Precautions  None      Restrictions   Weight Bearing Restrictions  No      Balance Screen   Has the patient fallen in the past 6 months  No    Has the patient had a decrease in activity level because of a fear of falling?   No    Is the patient reluctant to leave their home because of a fear of falling?  No      Home Film/video editor residence      Prior Function   Level of Independence  Independent    Vocation  Full time employment    Research scientist (physical sciences) work    Leisure  prior to surgery going to the gym and walking      Cognition   Overall Cognitive Status  Within Functional Limits for tasks assessed      Posture/Postural Control   Posture/Postural Control  No significant limitations      ROM / Strength   AROM / PROM / Strength  AROM;PROM;Strength      AROM   Overall AROM Comments  lumbar ROM limited by 25%      Strength   Right Hip Flexion  4/5    Right Hip Internal Rotation  5/5    Right Hip ABduction  3+/5    Left Hip Flexion  4/5    Left Hip Internal Rotation  5/5    Left Hip ABduction  4/5    Left Hip ADduction  4/5      Palpation   SI assessment   left ilium rotated posteriorly    Palpation comment  tenderness located in suprapubically and left  iliopsoas, left lumbar paraspinals      Transfers   Transfers  Independent with all Transfers      Ambulation/Gait   Ambulation/Gait  No                Objective measurements completed on examination: See above findings.    Pelvic Floor Special Questions - 04/16/18 0001    Prior Pregnancies  Yes    Number of Pregnancies  5 4 miscarriages    Number of Vaginal Deliveries  1    Episiotomy Performed  -- needed internal stitches    Currently Sexually Active  No    Urinary Leakage  Yes    How often  at night when sleeping and needs a panty liner    Pad use  2 1 panty liner per day and 1 per night    Activities that cause leaking  Other does not feel the leakage    Other activities that cause leaking  just happens     Urinary urgency  Yes sit to stand    Urinary frequency  every 45 minutes    Fecal incontinence  No    Skin Integrity  Intact    Scar  Well healed on abdomen    Perineal Body/Introitus   Normal    Prolapse  None    Pelvic Floor Internal Exam  Patient confirms identification and approves PT to assess and treat    Exam Type  Vaginal    Palpation  tenderness located on left urethra sphincter, left puborectalis and pubococcygeus, right sphincter    Strength  weak squeeze, no lift       [facility] Adult PT Treatment/Exercise - 04/16/18 0001      Manual Therapy   Manual Therapy  Internal Pelvic Floor    Internal Pelvic Floor  right sphincter, left puborectalis, left pubococcygeus, left uriethra sphincter             PT Education - 04/16/18 0939    Education Details  toileting technique    Person(s) Educated  Patient    Methods  Explanation;Demonstration;Verbal cues;Handout    Comprehension  Returned demonstration;Verbalized understanding       PT Short Term Goals - 04/16/18 3149  PT SHORT TERM GOAL #1   Title  independent with initial HEP     Time  4    Period  Weeks    Status  New    Target Date  05/14/18      PT SHORT TERM GOAL #2   Title   understand how to toilet correctly to relax the puborectalis and bear down while relaxing the anal muscles    Time  4    Period  Weeks    Status  New    Target Date  05/14/18      PT SHORT TERM GOAL #3   Title  urinary leakage decreased >/= 25% due to imrproved pelvic floor muscle control    Time  4    Period  Weeks    Status  New    Target Date  05/14/18      PT SHORT TERM GOAL #4   Title  pain suprapubically with coughing, sneezing, and laughing decreased >/= 25%    Time  4    Period  Weeks    Status  New    Target Date  05/14/18        PT Long Term Goals - 04/16/18 0953      PT LONG TERM GOAL #1   Title  independent with HEP and understand how to progress herself    Time  8    Period  Weeks    Status  New    Target Date  06/11/18      PT LONG TERM GOAL #2   Title  pain suprapubically minimal to none when she laughs, coughs, and sneezes     Time  8    Period  Weeks    Status  New    Target Date  06/11/18      PT LONG TERM GOAL #3   Title  urinary leakage during the day and night is none to minimal due to strength >/= 4/5    Time  8    Period  Weeks    Status  New    Target Date  06/11/18      PT LONG TERM GOAL #4   Title  ability to urinate every 2-3 hours due to reduction of trigger points in the pelvic floor     Time  8    Period  Weeks    Status  New    Target Date  06/11/18      PT LONG TERM GOAL #5   Title  going from sit to stand, urgency to urinate is minimal to none due to increased coordination of the pelvic floor    Time  8    Period  Weeks    Status  New    Target Date  06/11/18             Plan - 04/16/18 0943    Clinical Impression Statement  Patient is a 42 year old female with pelvic floor dysfunction after s/p Abdominal hysterectomy 12/05/2017.  Patient reports pain suprapubically 03/13/2018 with coughing, laughing, and sneezing.  Patient reports frequent urination every 45 minutes since surgery.  Patient leaks urine during the  night and day without sensation of it happening.  Paient wears one pantyliner during the day and night.  When patient has the urge especially when she goes from sitting to standing.  pelvic floor strength is 2/5 with no lift. Tenderness located in right sphincter, left puborectlis, left pubococcygeus, and left urethra sphincter.  Patient has difficulty with toileting for a bowel movement.  Surgical sites are healed, tender, and decreased mobilty. Bilateral hip strength is 4/5.  Patient will benefit from physical therapy to improve coordination of pelvic floor muscles while reducing pain and improve strength to reduce leakage.     History and Personal Factors relevant to plan of care:  s/p abdominal hysterectomy 12/05/2017; Fibromyalgia    Clinical Presentation  Stable    Clinical Presentation due to:  stable condition    Clinical Decision Making  Low    Rehab Potential  Excellent    Clinical Impairments Affecting Rehab Potential  s/p abdominal hysterectomy 12/05/2017; Fibromyalgia    PT Frequency  1x / week    PT Duration  8 weeks    PT Treatment/Interventions  Biofeedback;Cryotherapy;Electrical Stimulation;Moist Heat;Ultrasound;Therapeutic activities;Therapeutic exercise;Neuromuscular re-education;Patient/family education;Scar mobilization;Manual techniques;Passive range of motion;Dry needling    PT Next Visit Plan  abdominal massage; internal soft tissue work, hip stretches, core strength; correct ilium    Consulted and Agree with Plan of Care  Patient       Patient will benefit from skilled therapeutic intervention in order to improve the following deficits and impairments:  Increased fascial restricitons, Pain, Decreased coordination, Decreased scar mobility, Increased muscle spasms, Decreased endurance, Decreased strength  Visit Diagnosis: Muscle weakness (generalized) - Plan: PT plan of care cert/re-cert  Cramp and spasm - Plan: PT plan of care cert/re-cert  Unspecified lack of coordination -  Plan: PT plan of care cert/re-cert     Problem List Patient Active Problem List   Diagnosis Date Noted  . Postoperative state 12/05/2017  . Migraine without aura, with intractable migraine, so stated, without mention of status migrainosus 03/24/2014    Earlie Counts, PT 04/16/18 9:59 AM   [facility] [address] [place], [place], [address] Phone: [phone]   Fax:  [phone]  Name: CATELYNN SPARGER MRN: [id number] Date of Birth: 21-Nov-1975

## 2018-04-29 ENCOUNTER — Encounter: Payer: Self-pay | Admitting: Physical Therapy

## 2018-04-29 ENCOUNTER — Ambulatory Visit: Payer: 59 | Admitting: Physical Therapy

## 2018-04-29 DIAGNOSIS — M6281 Muscle weakness (generalized): Secondary | ICD-10-CM

## 2018-04-29 DIAGNOSIS — R279 Unspecified lack of coordination: Secondary | ICD-10-CM

## 2018-04-29 DIAGNOSIS — R252 Cramp and spasm: Secondary | ICD-10-CM

## 2018-04-29 NOTE — Patient Instructions (Signed)
Access Code: [email or web address]  URL: [email or web address]  Date: 04/29/2018  Prepared by: Earlie Counts   Exercises  Hooklying Transversus Abdominis Palpation - 10 reps - 1 sets - 5 sec hold - 1x daily - 7x weekly  Supine Transversus Abdominis Bracing with Double Leg Fallout - 10 reps - 1 sets - 1x daily - 7x weekly  Beginner Bridge - 10 reps - 1 sets - 1x daily - 7x weekly  Sidelying Pelvic Floor Contraction with Hip Abduction - 15 reps - 1 sets - 1x daily - 7x weekly  [facility] [address], [place], [place] [address] Phone # [phone] Fax [phone]

## 2018-04-29 NOTE — Therapy (Signed)
[facility] [address] [place], [place], [address] Phone: [phone]   Fax:  [phone]  Physical Therapy Treatment  Patient Details  Name: Barbara Travis MRN: [id number] Date of Birth: Sep 04, 1976 Referring Provider: Dr. Bobbye Charleston   Encounter Date: 04/29/2018  PT End of Session - 04/29/18 1314    Visit Number  2    Date for PT Re-Evaluation  06/11/18    Authorization Type  U[employer]    Authorization - Visit Number  2    Authorization - Number of Visits  20    PT Start Time  1230    PT Stop Time  1310    PT Time Calculation (min)  40 min    Activity Tolerance  Patient tolerated treatment well    Behavior During Therapy  [facility] for tasks assessed/performed       Past Medical History:  Diagnosis Date  . Asthma    allergy induced asthma  . Depression   . Fibromyalgia   . Migraine without aura, with intractable migraine, so stated, without mention of status migrainosus 03/24/2014    Past Surgical History:  Procedure Laterality Date  . ABDOMINAL HYSTERECTOMY     12-05-17 Dr. Philis Pique  . CYSTOSCOPY N/A 12/05/2017   Procedure: CYSTOSCOPY;  Surgeon: Bobbye Charleston, MD;  Location: [facility];  Service: Gynecology;  Laterality: N/A;  . DILATION AND CURETTAGE OF UTERUS    . ROBOTIC ASSISTED LAPAROSCOPIC HYSTERECTOMY AND SALPINGECTOMY N/A 12/05/2017   Procedure: XI ROBOTIC ASSISTED LAPAROSCOPIC HYSTERECTOMY AND Bilateral SALPINGECTOMY, Left OOphrectomy,  Peritoneal Biopsy Left;  Surgeon: Bobbye Charleston, MD;  Location: [facility];  Service: Gynecology;  Laterality: N/A;    There were no vitals filed for this visit.  Subjective Assessment - 04/29/18 1235    Subjective  I felt okay after the evaluation. No changes since eval. I only had pain when I layed down and coughed.  I have sneezed without pain.     Patient Stated Goals  urinate every 2-3 hours, reduce pain when cough and laugh or sneeze    Currently in Pain?  Yes    Pain Score  5     Pain Location  Abdomen    Pain Orientation  Lower;Mid    Pain Descriptors / Indicators  Stabbing;Cramping    Pain Type  Acute pain    Pain Onset  More than a month ago    Pain Frequency  Intermittent    Aggravating Factors   cough    Pain Relieving Factors  not cough    Multiple Pain Sites  No                       [facility] Adult PT Treatment/Exercise - 04/29/18 0001      Manual Therapy   Manual Therapy  Soft tissue mobilization;Myofascial release    Soft tissue mobilization  circular massage around the abdomen, along the diaphgram, in the umbilicus to release the scar    Myofascial Release  one hand on abdoment and other on sacrum releasing the 3 planes of fascia             PT Education - 04/29/18 1307    Education Details  Access Code: [email or web address]     Person(s) Educated  Patient    Methods  Explanation;Demonstration;Verbal cues;Handout    Comprehension  Returned demonstration;Verbalized understanding       PT Short Term Goals - 04/29/18 1313  PT SHORT TERM GOAL #1   Title  independent with initial HEP     Baseline  learning    Time  4    Period  Weeks    Status  On-going      PT SHORT TERM GOAL #2   Title  understand how to toilet correctly to relax the puborectalis and bear down while relaxing the anal muscles    Time  4    Period  Weeks    Status  Achieved      PT SHORT TERM GOAL #3   Title  urinary leakage decreased >/= 25% due to imrproved pelvic floor muscle control    Time  4    Period  Weeks    Status  On-going      PT SHORT TERM GOAL #4   Title  pain suprapubically with coughing, sneezing, and laughing decreased >/= 25%    Time  4    Period  Weeks    Status  Achieved        PT Long Term Goals - 04/16/18 3235      PT LONG TERM GOAL #1   Title  independent with HEP and understand how to progress herself    Time  8    Period  Weeks    Status  New    Target Date  06/11/18      PT LONG TERM GOAL #2    Title  pain suprapubically minimal to none when she laughs, coughs, and sneezes     Time  8    Period  Weeks    Status  New    Target Date  06/11/18      PT LONG TERM GOAL #3   Title  urinary leakage during the day and night is none to minimal due to strength >/= 4/5    Time  8    Period  Weeks    Status  New    Target Date  06/11/18      PT LONG TERM GOAL #4   Title  ability to urinate every 2-3 hours due to reduction of trigger points in the pelvic floor     Time  8    Period  Weeks    Status  New    Target Date  06/11/18      PT LONG TERM GOAL #5   Title  going from sit to stand, urgency to urinate is minimal to none due to increased coordination of the pelvic floor    Time  8    Period  Weeks    Status  New    Target Date  06/11/18            Plan - 04/29/18 1237    Clinical Impression Statement  Patient had improve tissue mobility after manual work to the abdomen.  Patient needed verabl and tactile cues for core strength. Patient reports she did not have pain with sneezing when she was sick.  Patient continues to have the urge to urinate. Patient will benefit from skilled therapy to improve coordination of pelvic floor muscles while reducing pain and improve strength to reduce leakage.     Rehab Potential  Excellent    Clinical Impairments Affecting Rehab Potential  s/p abdominal hysterectomy 12/05/2017; Fibromyalgia    PT Frequency  1x / week    PT Duration  8 weeks    PT Treatment/Interventions  Biofeedback;Cryotherapy;Electrical Stimulation;Moist Heat;Ultrasound;Therapeutic activities;Therapeutic exercise;Neuromuscular re-education;Patient/family education;Scar mobilization;Manual techniques;Passive range of  motion;Dry needling    PT Next Visit Plan  internal soft tissue work, hip stretches,  correct ilium; pelvic floor contraction with EMG    PT Home Exercise Plan  Access Code: [email or web address]     Recommended Other Services  MD signed initial eval    Consulted and Agree  with Plan of Care  Patient       Patient will benefit from skilled therapeutic intervention in order to improve the following deficits and impairments:  Increased fascial restricitons, Pain, Decreased coordination, Decreased scar mobility, Increased muscle spasms, Decreased endurance, Decreased strength  Visit Diagnosis: Muscle weakness (generalized)  Cramp and spasm  Unspecified lack of coordination     Problem List Patient Active Problem List   Diagnosis Date Noted  . Postoperative state 12/05/2017  . Migraine without aura, with intractable migraine, so stated, without mention of status migrainosus 03/24/2014    Earlie Counts, PT 04/29/18 1:15 PM   [facility] [address] [place], [place], [address] Phone: [phone]   Fax:  [phone]  Name: Barbara Travis MRN: [id number] Date of Birth: 04-20-1976

## 2018-05-04 ENCOUNTER — Ambulatory Visit: Payer: 59 | Attending: Obstetrics and Gynecology | Admitting: Physical Therapy

## 2018-05-04 ENCOUNTER — Encounter: Payer: Self-pay | Admitting: Physical Therapy

## 2018-05-04 DIAGNOSIS — R252 Cramp and spasm: Secondary | ICD-10-CM

## 2018-05-04 DIAGNOSIS — R279 Unspecified lack of coordination: Secondary | ICD-10-CM

## 2018-05-04 DIAGNOSIS — M6281 Muscle weakness (generalized): Secondary | ICD-10-CM | POA: Diagnosis present

## 2018-05-04 NOTE — Patient Instructions (Addendum)
Quick Contraction: Gravity Resisted (Sitting)    Sitting, quickly squeeze then fully relax pelvic floor. Perform _1__ sets of _5__. Rest for __1_ seconds between sets. Do _3__ times a day.  Copyright  VHI. All rights reserved.    Slow Contraction: Gravity Resisted (Sitting)    Sitting, slowly squeeze pelvic floor for _10__ seconds. Rest for _10__ seconds. Repeat _5__ times. Do __4_ times a day.  Copyright  VHI. All rights reserved.  Abdominal Bracing With Pelvic Floor (Hook-Lying)    With neutral spine, tighten pelvic floor and abdominals. Hold 5 sec; rest 5 sec. Repeat _10__ times. Do __1_ times a day.   Copyright  VHI. All rights reserved.    Bracing With Knee Fallout (Hook-Lying)    With neutral spine, tighten pelvic floor and abdominals and hold. Alternating legs, drop knee out to side. Keep opposite hip still. Repeat _10__ times. Do _1__ times a day.   Copyright  VHI. All rights reserved.  Bracing With Bridging (Hook-Lying)    With neutral spine, tighten pelvic floor and abdominals and hold. Lift bottom. Repeat __10_ times. Do _1__ times a day.   Copyright  VHI. All rights reserved.  [facility] [address], [place], [place] [address] Phone # [phone] Fax [phone]

## 2018-05-04 NOTE — Therapy (Addendum)
[facility] [address], [place], [place], [address] Phone: [phone]   Fax:  [phone]  Physical Therapy Treatment  Patient Details  Name: Barbara Travis MRN: [id number] Date of Birth: 1975/12/20 Referring Provider: Dr. Bobbye Charleston   Encounter Date: 05/04/2018  PT End of Session - 05/04/18 1405    Visit Number  3    Date for PT Re-Evaluation  06/11/18    Authorization Type  UHC    Authorization - Visit Number  3    Authorization - Number of Visits  20    PT Start Time  1400    PT Stop Time  1440    PT Time Calculation (min)  40 min    Activity Tolerance  Patient tolerated treatment well    Behavior During Therapy  [facility] for tasks assessed/performed       Past Medical History:  Diagnosis Date  . Asthma    allergy induced asthma  . Depression   . Fibromyalgia   . Migraine without aura, with intractable migraine, so stated, without mention of status migrainosus 03/24/2014    Past Surgical History:  Procedure Laterality Date  . ABDOMINAL HYSTERECTOMY     12-05-17 Dr. Philis Pique  . CYSTOSCOPY N/A 12/05/2017   Procedure: CYSTOSCOPY;  Surgeon: Bobbye Charleston, MD;  Location: [facility];  Service: Gynecology;  Laterality: N/A;  . DILATION AND CURETTAGE OF UTERUS    . ROBOTIC ASSISTED LAPAROSCOPIC HYSTERECTOMY AND SALPINGECTOMY N/A 12/05/2017   Procedure: XI ROBOTIC ASSISTED LAPAROSCOPIC HYSTERECTOMY AND Bilateral SALPINGECTOMY, Left OOphrectomy,  Peritoneal Biopsy Left;  Surgeon: Bobbye Charleston, MD;  Location: [facility];  Service: Gynecology;  Laterality: N/A;    There were no vitals filed for this visit.  Subjective Assessment - 05/04/18 1407    Subjective  Discomfort with recumbent bike. No discomfort since last session. No leakage with sneezing or coughing but will come out at night and during the day.     Patient Stated Goals  urinate every 2-3 hours, reduce pain when cough and laugh or sneeze    Currently in  Pain?  No/denies         [facility] PT Assessment - 05/04/18 0001      Palpation   SI assessment   left ilium rotated posteriorly                Pelvic Floor Special Questions - 05/04/18 0001    Biofeedback  quick 7 uv, resting tone 1 uv, sitting contract 10 seconds average 4 uv    Biofeedback sensor type  Surface vaginal    Biofeedback Activity  Quick contraction;10 second hold sitting        [facility] Adult PT Treatment/Exercise - 05/04/18 0001      Lumbar Exercises: Aerobic   Stationary Bike  4 min level 1 but hurt lower abdomen      Manual Therapy   Manual Therapy  Muscle Energy Technique    Muscle Energy Technique  to correct left ilium             PT Education - 05/04/18 1431    Education Details  pelvic floor contraction in sitting, core strength with pelvic floor contraction    Person(s) Educated  Patient    Methods  Explanation;Demonstration;Verbal cues;Handout    Comprehension  Returned demonstration;Verbalized understanding       PT Short Term Goals - 05/04/18 1430      PT SHORT TERM GOAL #1   Title  independent  with initial HEP     Time  4    Period  Weeks    Status  Achieved      PT SHORT TERM GOAL #2   Title  understand how to toilet correctly to relax the puborectalis and bear down while relaxing the anal muscles    Time  4    Period  Weeks    Status  Achieved      PT SHORT TERM GOAL #3   Title  urinary leakage decreased >/= 25% due to imrproved pelvic floor muscle control    Time  4    Period  Weeks    Status  On-going      PT SHORT TERM GOAL #4   Title  pain suprapubically with coughing, sneezing, and laughing decreased >/= 25%    Time  4    Period  Weeks    Status  Achieved        PT Long Term Goals - 04/16/18 0086      PT LONG TERM GOAL #1   Title  independent with HEP and understand how to progress herself    Time  8    Period  Weeks    Status  New    Target Date  06/11/18      PT LONG TERM GOAL #2   Title  pain  suprapubically minimal to none when she laughs, coughs, and sneezes     Time  8    Period  Weeks    Status  New    Target Date  06/11/18      PT LONG TERM GOAL #3   Title  urinary leakage during the day and night is none to minimal due to strength >/= 4/5    Time  8    Period  Weeks    Status  New    Target Date  06/11/18      PT LONG TERM GOAL #4   Title  ability to urinate every 2-3 hours due to reduction of trigger points in the pelvic floor     Time  8    Period  Weeks    Status  New    Target Date  06/11/18      PT LONG TERM GOAL #5   Title  going from sit to stand, urgency to urinate is minimal to none due to increased coordination of the pelvic floor    Time  8    Period  Weeks    Status  New    Target Date  06/11/18            Plan - 05/04/18 1404    Clinical Impression Statement  Patient pelvis in correct alignment after manual work. Patient resting tone of the pelvic floor is 1 uv.  Patient is able to perform quick flicks to 7 uv in sitting. Patient is able to contract the pelvic floor for 10 seconds in sitting but needs to keep re-engaging the pelvic floor.  Patient will benefit from skilled therapy to improve coordination of pelvic floor muscles while reducing pain and improve strength to reduce leakage.     Rehab Potential  Excellent    Clinical Impairments Affecting Rehab Potential  s/p abdominal hysterectomy 12/05/2017; Fibromyalgia    PT Frequency  1x / week    PT Duration  8 weeks    PT Treatment/Interventions  Biofeedback;Cryotherapy;Electrical Stimulation;Moist Heat;Ultrasound;Therapeutic activities;Therapeutic exercise;Neuromuscular re-education;Patient/family education;Scar mobilization;Manual techniques;Passive range of motion;Dry needling    PT  Next Visit Plan   hip stretches,  correct ilium; pelvic floor contraction with EMG with hip strength    Consulted and Agree with Plan of Care  Patient       Patient will benefit from skilled therapeutic  intervention in order to improve the following deficits and impairments:  Increased fascial restricitons, Pain, Decreased coordination, Decreased scar mobility, Increased muscle spasms, Decreased endurance, Decreased strength  Visit Diagnosis: Muscle weakness (generalized)  Cramp and spasm  Unspecified lack of coordination     Problem List Patient Active Problem List   Diagnosis Date Noted  . Postoperative state 12/05/2017  . Migraine without aura, with intractable migraine, so stated, without mention of status migrainosus 03/24/2014    Earlie Counts, PT 05/04/18 2:43 PM   [facility] [address] [place], [place], [address] Phone: [phone]   Fax:  [phone]  Name: Barbara Travis MRN: [id number] Date of Birth: July 01, 1976 PHYSICAL THERAPY DISCHARGE SUMMARY  Visits from Start of Care: 3 Current functional level related to goals / functional outcomes: See above.    Remaining deficits: See above. Unable to reassess patient due to not returning since last visit on 05/04/2018.   Education / Equipment: HEP Plan: Patient agrees to discharge.  Patient goals were not met. Patient is being discharged due to not returning since the last visit.  Thank you for the referral .Earlie Counts, PT 06/16/18 8:51 AM  ?????

## 2018-05-11 ENCOUNTER — Ambulatory Visit: Payer: 59 | Admitting: Physical Therapy

## 2018-05-18 ENCOUNTER — Encounter: Payer: 59 | Admitting: Physical Therapy

## 2018-06-01 ENCOUNTER — Encounter: Payer: 59 | Admitting: Physical Therapy

## 2019-12-13 IMAGING — MR MR PELVIS WO/W CM
6 of 12 series · 25 of 48 positions shown · IV contrast (15ml Multihance)
Comparison: None.

CLINICAL DATA: Fibroids.  Family hx of colon and uterine CA.

EXAM:
MRI PELVIS WITHOUT AND WITH CONTRAST
TECHNIQUE: Multiplanar multisequence MR imaging of the pelvis was performed
both before and after administration of intravenous contrast.
CONTRAST:  15 mL MultiHance

[Series 3: T2 · coronal · 5.0mm · 1.56mm/px · 4 of 20 slices shown]
[im 1/20]
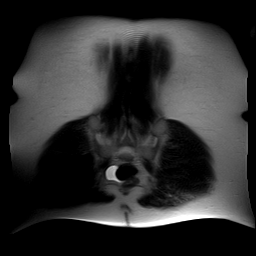
[im 7/20]
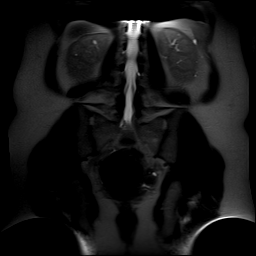
[im 13/20]
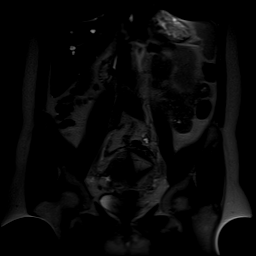
[im 20/20]
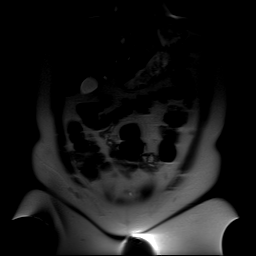

[Series 4: t2_tse_sag · sagittal · 5.0mm · 0.51mm/px · 4 of 25 slices shown]
[im 1/25]
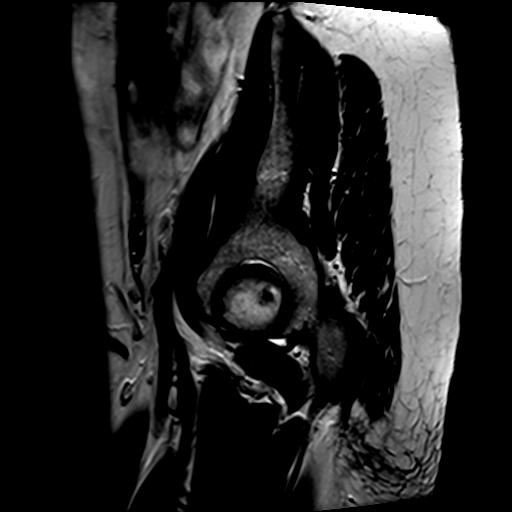
[im 9/25]
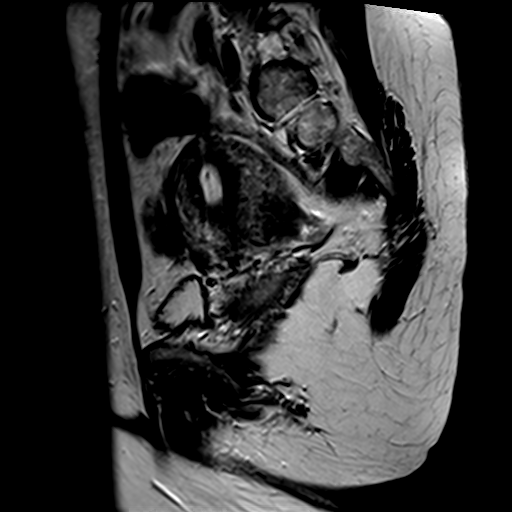
[im 17/25]
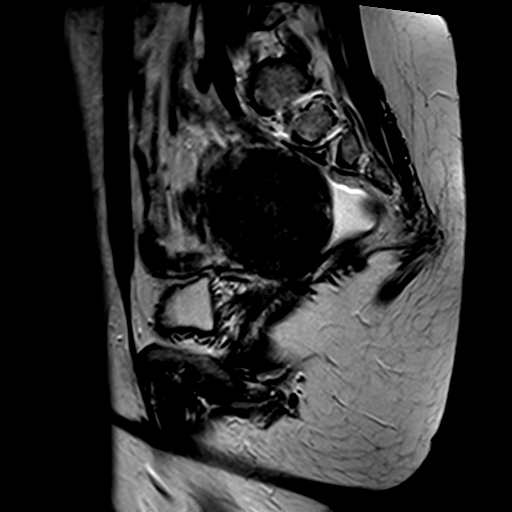
[im 25/25]
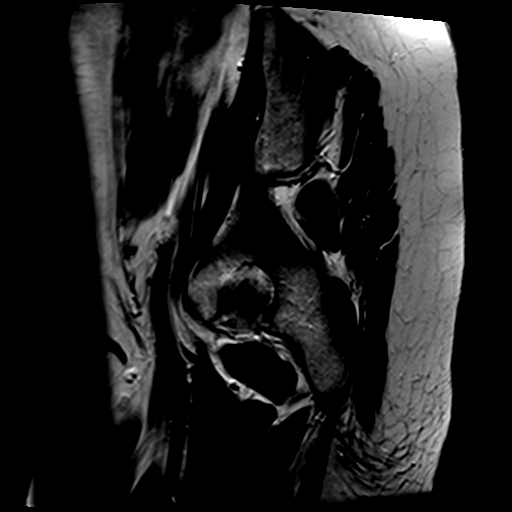

[Series 5: t2_tse axial · axial · 5.0mm · 0.51mm/px · z∈[-72,+114]mm · 5 of 32 slices shown]
[im 1/32]
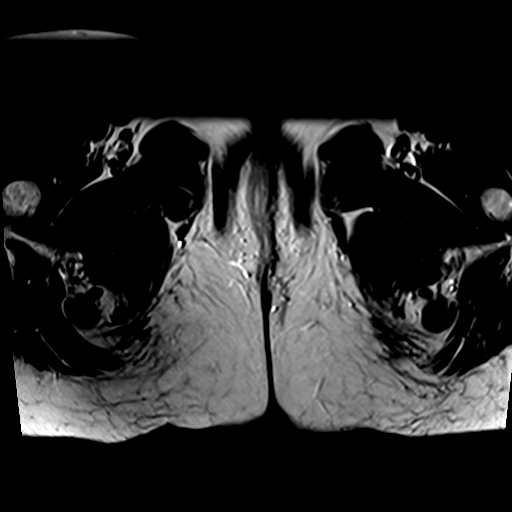
[im 8/32]
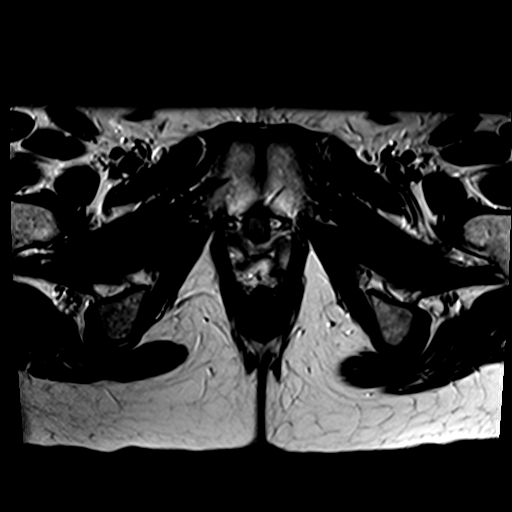
[im 16/32]
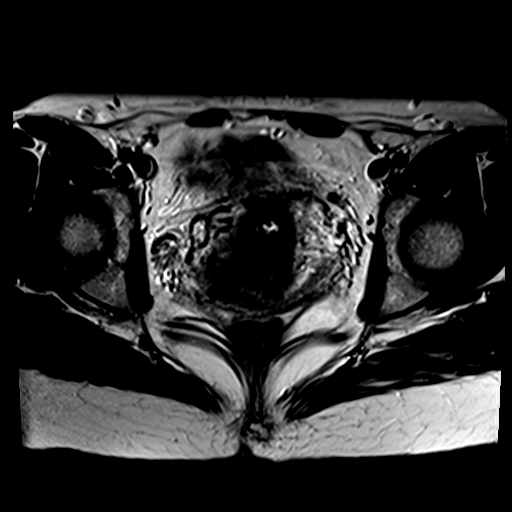
[im 24/32]
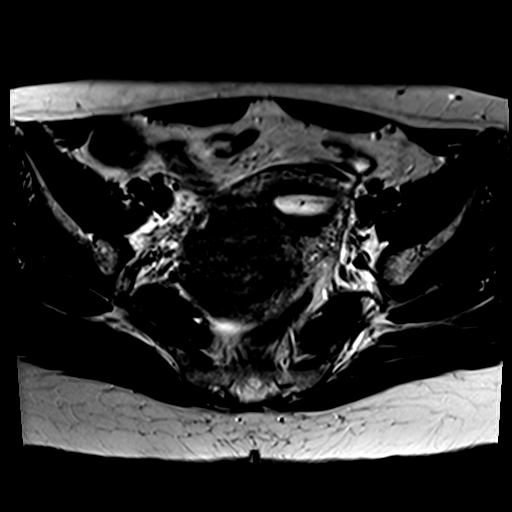
[im 32/32]
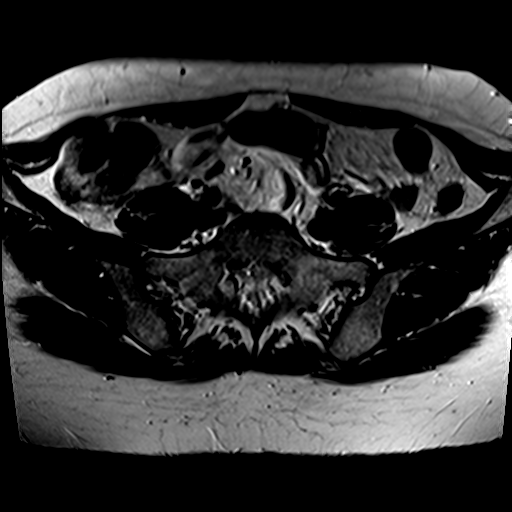

[Series 6: t2_tse axial fs · axial · 5.0mm · 0.51mm/px · z∈[-72,+114]mm · 4 of 32 slices shown]
[im 1/32]
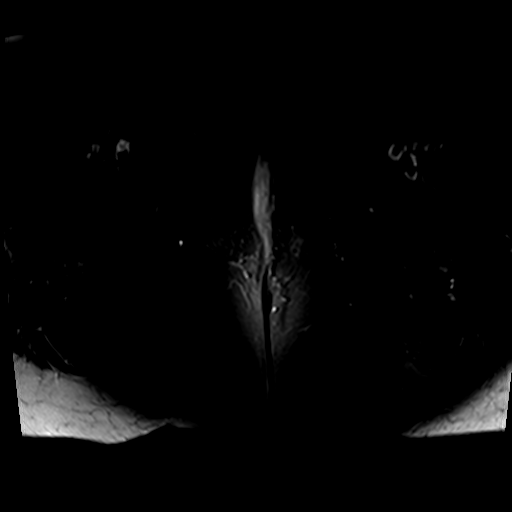
[im 11/32]
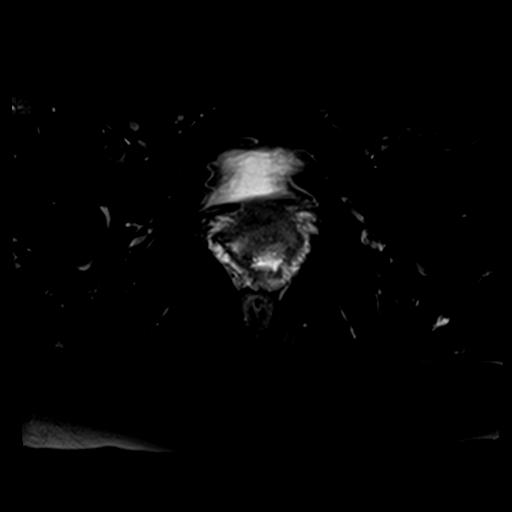
[im 21/32]
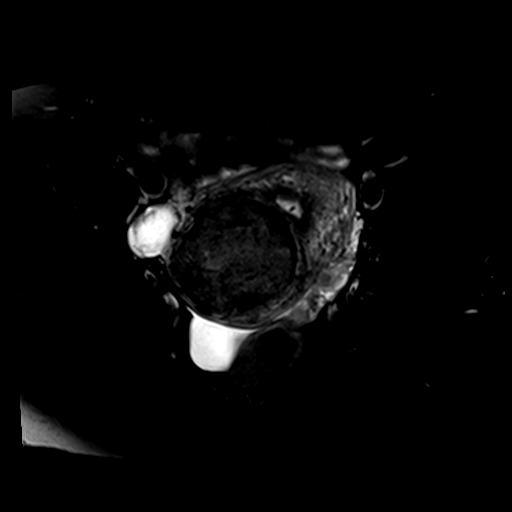
[im 32/32]
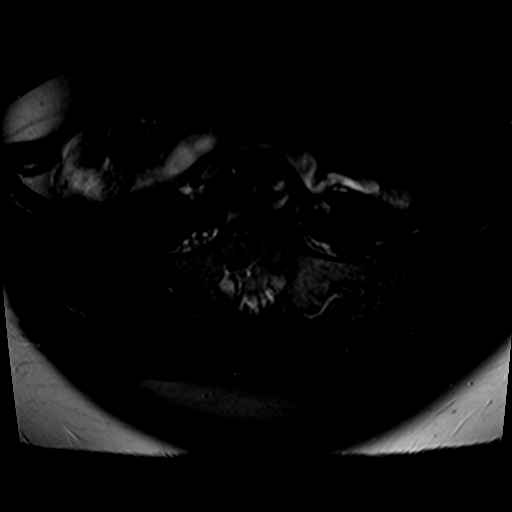

[Series 7: axial spgr no · axial · 5.0mm · 0.51mm/px · z∈[-72,+114]mm · 4 of 32 slices shown]
[im 1/32]
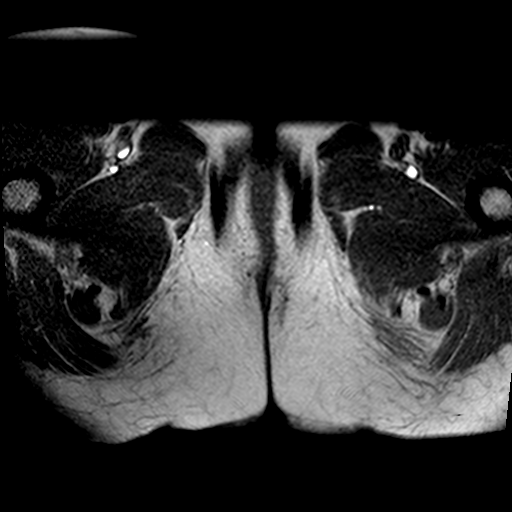
[im 11/32]
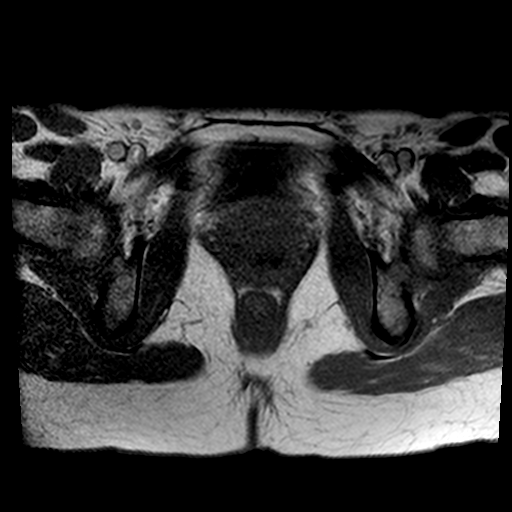
[im 21/32]
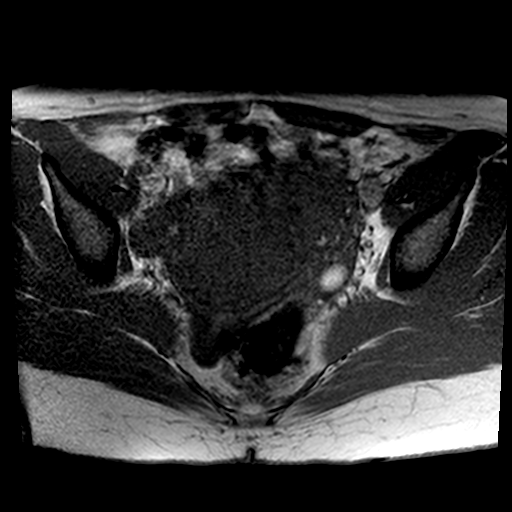
[im 32/32]
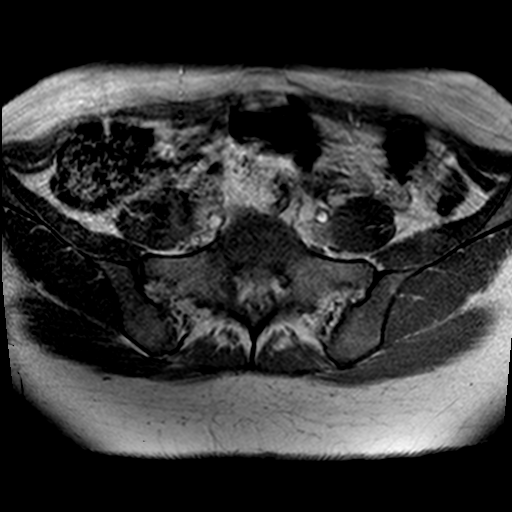

[Series 8: axial spgr fs · axial · non-contrast · 5.0mm · 1.02mm/px · z∈[-72,+114]mm · 4 of 32 slices shown]
[im 1/32]
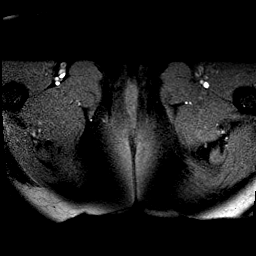
[im 11/32]
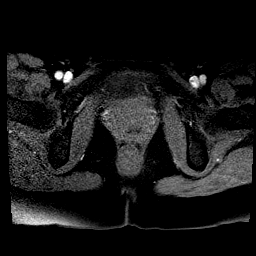
[im 21/32]
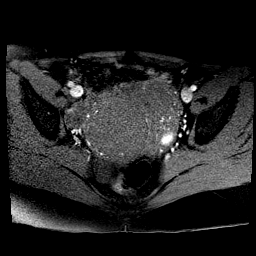
[im 32/32]
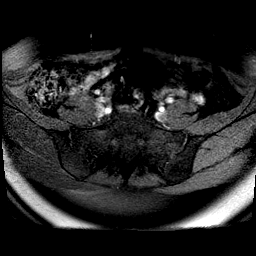

[25 of 48 positions shown; findings below may reference images not displayed]

FINDINGS: Urinary Tract: Bladder normal

Bowel: Limited view of the colon unremarkable rectum normal

Vascular/Lymphatic:  No pelvic lymphadenopathy.  No vascular anomaly

Reproductive:

Uterus: Measures vertical Uterus measures 10.0 x 6.9 x 10.4 cm. The
endometrium measures 6 mm. The junctional zone is normal at 5 mm.

. Along the posterior RIGHT wall of the uterine body is a round
intramural leiomyoma measuring 6.1 x 6.4 by 6.2 cm. This leiomyoma
has uniform post-contrast enhancement (series 13).

No additional leiomyoma.  Cervix and vagina grossly normal.

Right ovary: 3.2 x 2.5 x 3.9 cm with a functional ovarian cyst.
(Image 4, series 4)

Left ovary: 3.0 x 2.0 x 2.9 cm. Normal small follicle and a 16 mm
hemorrhagic cyst (image 19, series 4).

Other: No free-fluid.

Musculoskeletal:  No aggressive osseous lesion
IMPRESSION: 1. Large intramural leiomyoma in the posterior wall the uterus with
uniform post-contrast enhancement.
2. Normal endometrium and junctional zone.
3. Normal ovaries.

## 2023-06-23 DIAGNOSIS — F410 Panic disorder [episodic paroxysmal anxiety] without agoraphobia: Secondary | ICD-10-CM | POA: Diagnosis not present

## 2023-06-23 DIAGNOSIS — F3342 Major depressive disorder, recurrent, in full remission: Secondary | ICD-10-CM | POA: Diagnosis not present

## 2023-06-23 DIAGNOSIS — F900 Attention-deficit hyperactivity disorder, predominantly inattentive type: Secondary | ICD-10-CM | POA: Diagnosis not present

## 2023-12-16 DIAGNOSIS — F331 Major depressive disorder, recurrent, moderate: Secondary | ICD-10-CM | POA: Diagnosis not present

## 2023-12-16 DIAGNOSIS — F900 Attention-deficit hyperactivity disorder, predominantly inattentive type: Secondary | ICD-10-CM | POA: Diagnosis not present

## 2023-12-16 DIAGNOSIS — F410 Panic disorder [episodic paroxysmal anxiety] without agoraphobia: Secondary | ICD-10-CM | POA: Diagnosis not present

## 2024-01-01 DIAGNOSIS — R21 Rash and other nonspecific skin eruption: Secondary | ICD-10-CM | POA: Diagnosis not present

## 2024-06-24 DIAGNOSIS — F900 Attention-deficit hyperactivity disorder, predominantly inattentive type: Secondary | ICD-10-CM | POA: Diagnosis not present

## 2024-06-24 DIAGNOSIS — F410 Panic disorder [episodic paroxysmal anxiety] without agoraphobia: Secondary | ICD-10-CM | POA: Diagnosis not present

## 2024-06-24 DIAGNOSIS — F3342 Major depressive disorder, recurrent, in full remission: Secondary | ICD-10-CM | POA: Diagnosis not present

## 2024-09-27 ENCOUNTER — Emergency Department (HOSPITAL_COMMUNITY)
Admission: EM | Admit: 2024-09-27 | Discharge: 2024-09-27 | Disposition: A | Discharge status: Home / Self Care | Attending: Emergency Medicine | Admitting: Emergency Medicine

## 2024-09-27 ENCOUNTER — Emergency Department (HOSPITAL_COMMUNITY)

## 2024-09-27 ENCOUNTER — Other Ambulatory Visit: Payer: Self-pay

## 2024-09-27 DIAGNOSIS — J45909 Unspecified asthma, uncomplicated: Secondary | ICD-10-CM | POA: Insufficient documentation

## 2024-09-27 DIAGNOSIS — I10 Essential (primary) hypertension: Secondary | ICD-10-CM | POA: Diagnosis not present

## 2024-09-27 DIAGNOSIS — R55 Syncope and collapse: Secondary | ICD-10-CM | POA: Insufficient documentation

## 2024-09-27 DIAGNOSIS — R4182 Altered mental status, unspecified: Secondary | ICD-10-CM | POA: Diagnosis not present

## 2024-09-27 DIAGNOSIS — R410 Disorientation, unspecified: Secondary | ICD-10-CM | POA: Diagnosis not present

## 2024-09-27 DIAGNOSIS — F32A Depression, unspecified: Secondary | ICD-10-CM | POA: Diagnosis not present

## 2024-09-27 LAB — COMPREHENSIVE METABOLIC PANEL WITH GFR
ALT: 23 U/L (ref 0–44)
AST: 31 U/L (ref 15–41)
Albumin: 3.5 g/dL (ref 3.5–5.0)
Alkaline Phosphatase: 74 U/L (ref 38–126)
Anion gap: 7 (ref 5–15)
BUN: 19 mg/dL (ref 6–20)
CO2: 25 mmol/L (ref 22–32)
Calcium: 8.5 mg/dL — ABNORMAL LOW (ref 8.9–10.3)
Chloride: 109 mmol/L (ref 98–111)
Creatinine, Ser: 0.88 mg/dL (ref 0.44–1.00)
GFR, Estimated: >60 mL/min (ref >60)
Glucose, Bld: 109 mg/dL — ABNORMAL HIGH (ref 70–99)
Potassium: 3.8 mmol/L (ref 3.5–5.1)
Sodium: 141 mmol/L (ref 135–145)
Total Bilirubin: 0.2 mg/dL (ref 0.0–1.2)
Total Protein: 6.5 g/dL (ref 6.5–8.1)

## 2024-09-27 LAB — CBC WITH DIFFERENTIAL/PLATELET
Abs Immature Granulocytes: 0.02 K/uL (ref 0.00–0.07)
Basophils Absolute: 0 K/uL (ref 0.0–0.1)
Basophils Relative: 1 %
Eosinophils Absolute: 0 K/uL (ref 0.0–0.5)
Eosinophils Relative: 0 %
HCT: 34.4 % — ABNORMAL LOW (ref 36.0–46.0)
Hemoglobin: 10.8 g/dL — ABNORMAL LOW (ref 12.0–15.0)
Immature Granulocytes: 0 %
Lymphocytes Relative: 22 %
Lymphs Abs: 1.5 K/uL (ref 0.7–4.0)
MCH: 27.3 pg (ref 26.0–34.0)
MCHC: 31.4 g/dL (ref 30.0–36.0)
MCV: 87.1 fL (ref 80.0–100.0)
Monocytes Absolute: 0.6 K/uL (ref 0.1–1.0)
Monocytes Relative: 9 %
Neutro Abs: 4.7 K/uL (ref 1.7–7.7)
Neutrophils Relative %: 68 %
Platelets: 360 K/uL (ref 150–400)
RBC: 3.95 MIL/uL (ref 3.87–5.11)
RDW: 13.7 % (ref 11.5–15.5)
WBC: 6.8 K/uL (ref 4.0–10.5)
nRBC: 0 % (ref 0.0–0.2)

## 2024-09-27 LAB — URINE DRUG SCREEN
Amphetamines: POSITIVE — AB
Barbiturates: NEGATIVE
Benzodiazepines: POSITIVE — AB
Cocaine: NEGATIVE
Fentanyl: NEGATIVE
Methadone Scn, Ur: NEGATIVE
Opiates: NEGATIVE
Tetrahydrocannabinol: NEGATIVE

## 2024-09-27 LAB — URINALYSIS, W/ REFLEX TO CULTURE (INFECTION SUSPECTED)
Bacteria, UA: NONE SEEN
Bilirubin Urine: NEGATIVE
Glucose, UA: NEGATIVE mg/dL
Hgb urine dipstick: NEGATIVE
Ketones, ur: NEGATIVE mg/dL
Leukocytes,Ua: NEGATIVE
Nitrite: NEGATIVE
Protein, ur: NEGATIVE mg/dL
Specific Gravity, Urine: 1.018 (ref 1.005–1.030)
pH: 5 (ref 5.0–8.0)

## 2024-09-27 LAB — ETHANOL: Alcohol, Ethyl (B): <15 mg/dL (ref <15)

## 2024-09-27 MED ORDER — SODIUM CHLORIDE 0.9 % IV BOLUS
1000.0000 mL | Freq: Once | INTRAVENOUS | Status: AC
  Administered 2024-09-27: 1000 mL via INTRAVENOUS

## 2024-09-27 NOTE — ED Notes (Signed)
 Patient alert and oriented x 4. Airway patent, respirations even and unlabored. Skin normal, warm and dry. Discharge instructions discussed with patient and patient's family. Patient has no questions at this time. Patient has safe ride home.

## 2024-09-27 NOTE — ED Triage Notes (Addendum)
 Per EMS patient coming from home, took 2 muscle relaxers this AM, family reports patient got up to use bathroom and had syncopal episode, did not hit her head, patient a/o  x 2 upon EMS arrival, patient became a/o x 4 en route BP 164/98 HR 55 98% GB 186 PIV 22g Left AC. Per EMS family is concerned for failure to thrive, not eating and drinking. Patient is alert and oriented x 4. Airway patent, respirations even and unlabored. Skin normal, warm and dry. patient has no complaints at this time. Siderails up x 2. Call light at bedside. HX of fibromyalgia.

## 2024-09-27 NOTE — ED Notes (Signed)
 Pt is aware that a UA is needed, pt states  can you give me an hour, I don't have to pee right now.

## 2024-09-27 NOTE — ED Provider Notes (Signed)
 [place] EMERGENCY DEPARTMENT AT [facility] Provider Note  CSN: [id number] Arrival date & time: 09/27/24 1026  Chief Complaint(s) Loss of Consciousness  HPI Barbara Travis is a 48 y.o. female with past medical history as below, significant for MDD, ADHD, migraine, panic disorder, asthma, fibromyalgia, who presents to the ED with complaint of confusion and near syncope.  Patient reports she woke up this morning took 2 muscle relaxers and then about 2 hours later woke up to 4 large gushes of clear fluid in the bed.  Reports she tried to get out of bed but slid down the wall onto the floor.  Denies LOC or head trauma.  Reports her daughter-in-law was present and said that she was confused, thought she was at her mother's house.  This resolved when EMS arrived.  Reports history of similar fainting and weakness events in the setting of her fibromyalgia.  Additionally takes Xanax  for panic disorder, patient states she is only taking at night but her son reports she takes it throughout the day.  Reports she has been a migraine episode for the past week with significant nausea and stomach upset with decreased p.o. intake.  Presents with son who provides additional history.   Past Medical History Past Medical History:  Diagnosis Date   Asthma    allergy induced asthma   Depression    Fibromyalgia    Migraine without aura, with intractable migraine, so stated, without mention of status migrainosus 03/24/2014   Patient Active Problem List   Diagnosis Date Noted   Postoperative state 12/05/2017   Intractable migraine without aura 03/24/2014   Home Medication(s) Prior to Admission medications   Medication Sig Start Date End Date Taking? Authorizing Provider  albuterol  (PROVENTIL  HFA;VENTOLIN  HFA) 108 (90 Base) MCG/ACT inhaler Inhale into the lungs as needed for wheezing or shortness of breath.    [provider]  ALPRAZolam  (XANAX ) 1 MG tablet Take 1 mg by mouth 4 (four)  times daily as needed for anxiety.  03/11/14   [provider]  amphetamine-dextroamphetamine (ADDERALL) 30 MG tablet Take 30 mg by mouth 2 (two) times daily.    [provider]  Beclomethasone Diprop, Nasal, (QNASL  NA) Place 1 spray into the nose daily as needed (allergies).     [provider]  cetirizine (ZYRTEC) 10 MG tablet Take 10 mg by mouth daily.    [provider]  cyclobenzaprine  (FLEXERIL ) 10 MG tablet Take 10 mg by mouth 3 (three) times daily as needed for muscle spasms.    [provider]  fexofenadine (ALLEGRA ODT) 30 MG disintegrating tablet Take 30 mg by mouth daily.    [provider]  hydrOXYzine (ATARAX/VISTARIL) 25 MG tablet Take 25 mg by mouth 3 (three) times daily as needed for itching (allergies).     [provider]  lamoTRIgine  (LAMICTAL ) 200 MG tablet Take 400 mg by mouth daily.  03/15/14   [provider]  montelukast  (SINGULAIR ) 10 MG tablet Take 10 mg by mouth daily. 03/23/14   [provider]  naproxen sodium (ANAPROX) 220 MG tablet Take 440 mg by mouth daily as needed (headache/pain).     [provider]  oxyCODONE -acetaminophen  (PERCOCET/ROXICET) 5-325 MG tablet Take 1 tablet by mouth every 4 (four) hours as needed for moderate pain ((when tolerating fluids)). Patient not taking: Reported on 04/16/2018 12/06/17   Sarrah Browning, MD  phenylephrine (SUDAFED PE) 10 MG TABS tablet Take 10 mg by mouth every 4 (four) hours as needed.  [provider]  rizatriptan  (MAXALT -MLT) 10 MG disintegrating tablet Take 1 tablet (10 mg total) by mouth as needed for migraine. May repeat in 2 hours if needed 02/14/15   Millikan, Megan, NP  topiramate  (TOPAMAX ) 100 MG tablet Take 1 tablet (100 mg total) by mouth 2 (two) times daily. Patient not taking: Reported on 04/16/2018 02/14/15   Sherryl Bouchard, NP                                                                                                                                     Past Surgical History Past Surgical History:  Procedure Laterality Date   ABDOMINAL HYSTERECTOMY     12-05-17 Dr. Sarrah   CYSTOSCOPY N/A 12/05/2017   Procedure: CYSTOSCOPY;  Surgeon: Sarrah Browning, MD;  Location: [facility];  Service: Gynecology;  Laterality: N/A;   DILATION AND CURETTAGE OF UTERUS     ROBOTIC ASSISTED LAPAROSCOPIC HYSTERECTOMY AND SALPINGECTOMY N/A 12/05/2017   Procedure: XI ROBOTIC ASSISTED LAPAROSCOPIC HYSTERECTOMY AND Bilateral SALPINGECTOMY, Left OOphrectomy,  Peritoneal Biopsy Left;  Surgeon: Sarrah Browning, MD;  Location: [facility];  Service: Gynecology;  Laterality: N/A;   Family History Family History  Problem Relation Age of Onset   Migraines Mother    Colon cancer Father    Cancer Maternal Grandfather    Heart disease Maternal Grandfather    Cancer Paternal Grandmother    Cancer Paternal Grandfather     Social History Social History   Tobacco Use   Smoking status: Never   Smokeless tobacco: Never  Vaping Use   Vaping status: Never Used  Substance Use Topics   Alcohol use: Yes    Comment: rare   Drug use: No   Allergies Sulfa antibiotics  Review of Systems A thorough review of systems was obtained and all systems are negative except as noted in the HPI and PMH.   Physical Exam Vital Signs  I have reviewed the triage vital signs BP (!) 137/92   Pulse (!) 50   Temp 97.7 F (36.5 C) (Oral)   Resp 11   Ht 5' 6 (1.676 m)   Wt 86.2 kg   LMP 01/20/2014   SpO2 97%   BMI 30.67 kg/m  Physical Exam General: Some slurred speech.  Alert. NAD HEENT: Normocephalic. White sclera. No rhinorrhea or congestion.  Dry mucous membranes. CV: Bradycardia.  Regular rhythm.  No murmur Pulm: CTAB. Normal WOB on RA. No wheezing Abdomen: Soft, non-tender, non-distended. +BS Ext: No peripheral edema Skin: Warm, dry.  Multiple excoriation marks across face and chin Neuro: CN II: PERRL CN III, IV,VI: EOMI CV V: Normal  sensation in V1, V2, V3 CVII: Symmetric smile and brow raise CN VIII: Normal hearing CN IX,X: Symmetric palate raise  CN XI: 5/5 shoulder shrug CN XII: Symmetric tongue protrusion  UE and LE strength 5/5 Normal sensation in UE and LE bilaterally  ED Results and Treatments Labs (all labs  ordered are listed, but only abnormal results are displayed) Labs Reviewed  COMPREHENSIVE METABOLIC PANEL WITH GFR - Abnormal; Notable for the following components:      Result Value   Glucose, Bld 109 (*)    Calcium 8.5 (*)    All other components within normal limits  CBC WITH DIFFERENTIAL/PLATELET - Abnormal; Notable for the following components:   Hemoglobin 10.8 (*)    HCT 34.4 (*)    All other components within normal limits  ETHANOL  URINALYSIS, W/ REFLEX TO CULTURE (INFECTION SUSPECTED)  URINE DRUG SCREEN                                                                                                                          Radiology CT Head Wo Contrast Result Date: 09/27/2024 EXAM: CT HEAD WITHOUT CONTRAST 09/27/2024 11:37:00 AM TECHNIQUE: CT of the head was performed without the administration of intravenous contrast. Automated exposure control, iterative reconstruction, and/or weight based adjustment of the mA/kV was utilized to reduce the radiation dose to as low as reasonably achievable. COMPARISON: Head MRI 03/01/2015. CLINICAL HISTORY: Mental status change, unknown cause. FINDINGS: BRAIN AND VENTRICLES: There is no evidence of an acute infarct, intracranial hemorrhage, mass, midline shift, hydrocephalus, or extra-axial fluid collection. Cerebral volume is normal. A partially empty sella is noted. ORBITS: No acute abnormality. SINUSES: No acute abnormality. SOFT TISSUES AND SKULL: No acute soft tissue abnormality. No skull fracture. IMPRESSION: 1. No acute intracranial abnormality. Electronically signed by: Dasie Hamburg MD 09/27/2024 11:44 AM EST RP Workstation: [id number]    Pertinent  labs & imaging results that were available during my care of the patient were reviewed by me and considered in my medical decision making (see MDM for details).  Medications Ordered in ED Medications  sodium chloride  0.9 % bolus 1,000 mL (1,000 mLs Intravenous New Bag/Given 09/27/24 1105)                                                                   Medical Decision Making / ED Course    Medical Decision Making:    Barbara Travis is a 48 y.o. female with past medical history as below, significant for MDD, ADHD, migraine, panic disorder, asthma, fibromyalgia, who presents to the ED with complaint of confusion and near syncope. The complaint involves an extensive differential diagnosis and also carries with it a high risk of complications and morbidity.  Serious etiology was considered. Ddx includes but is not limited to: Substance-induced, TIA/CVA, complex migraine, seizure, dehydration  Complete initial physical exam performed, notably the patient was in no distress.    Reviewed and confirmed nursing documentation for past medical history, family history, social history.  Vital signs reviewed.   48 year old female with history  as above presenting with confusion and near syncopal episode that occurred this a.m. now back to neurologic baseline per family.  Patient on multiple centrally acting agents including Flexeril  and Xanax , endorses taking 2 tablets of Flexeril  prior to event which likely contributed to presentation.  Normal neurologic exam, mild slurred speech possibly substance-induced.  Family endorses history of recurrent syncope and falls, requesting head imaging given prior head trauma although this did not occur during this event.  Softer BPs and bradycardia, in the setting of decreased p.o. intake over the past week in the setting of migrainous event.  Will give 1L and reevaluate.  Lab work overall reassuring, mild chronic anemia stable at baseline.   Clinical Course as of  09/27/24 1348  Mon Sep 27, 2024  1244 CT head negative for acute etiology.  BP normal.  Pending urine studies anticipate discharge. [KH]  1347 UA normal.  Discussed with patient, she is agreeable to discharge and discussed return precautions.  Recommend reducing centrally acting agents such as muscle relaxer and Xanax  to prevent oversedation as possible. [KH]    Clinical Course User Index [KH] Theophilus Pagan, MD      Additional history obtained: -Additional history obtained from family -External records from outside source obtained and reviewed including: Chart review including previous notes, labs, imaging, consultation notes including: Psychiatry OP notes   Lab Tests: -I ordered, reviewed, and interpreted labs.   The pertinent results include:   Labs Reviewed  COMPREHENSIVE METABOLIC PANEL WITH GFR - Abnormal; Notable for the following components:      Result Value   Glucose, Bld 109 (*)    Calcium 8.5 (*)    All other components within normal limits  CBC WITH DIFFERENTIAL/PLATELET - Abnormal; Notable for the following components:   Hemoglobin 10.8 (*)    HCT 34.4 (*)    All other components within normal limits  ETHANOL  URINALYSIS, W/ REFLEX TO CULTURE (INFECTION SUSPECTED)  URINE DRUG SCREEN     EKG   EKG Interpretation Date/Time:  Monday September 27 2024 10:44:59 EST Ventricular Rate:  56 PR Interval:    QRS Duration:  89 QT Interval:  467 QTC Calculation: 451 R Axis:   33  Text Interpretation: poor baseline ST elev, probable normal early repol pattern Confirmed by Laurice Coy [phone]) on 09/27/2024 11:02:06 AM         Imaging Studies ordered: I ordered imaging studies including CT head wo I independently visualized the following imaging with scope of interpretation limited to determining acute life threatening conditions related to emergency care; findings noted above I agree with the radiologist interpretation If any imaging was obtained with  contrast I closely monitored patient for any possible adverse reaction a/w contrast administration in the emergency department   Medicines ordered and prescription drug management: Meds ordered this encounter  Medications   sodium chloride  0.9 % bolus 1,000 mL    -I have reviewed the patients home medicines and have made adjustments as needed   Cardiac Monitoring: The patient was maintained on a cardiac monitor.  I personally viewed and interpreted the cardiac monitored which showed an underlying rhythm of: Sinus bradycardia Continuous pulse oximetry interpreted by myself, 100% on RA.    Social Determinants of Health:  Diagnosis or treatment significantly limited by social determinants of health: obesity and unemployed   Reevaluation: After the interventions noted above, I reevaluated the patient and found that they have resolved  Co morbidities that complicate the patient evaluation  Past Medical History:  Diagnosis Date   Asthma    allergy induced asthma   Depression    Fibromyalgia    Migraine without aura, with intractable migraine, so stated, without mention of status migrainosus 03/24/2014      Dispostion: Disposition decision including need for hospitalization was considered, and patient discharged from emergency department.    Final Clinical Impression(s) / ED Diagnoses Final diagnoses:  Near syncope  Confusion        Theophilus Pagan, MD 09/27/24 1348    Laurice Maude BROCKS, MD 09/27/24 [phone]

## 2024-09-27 NOTE — ED Notes (Signed)
 Per patient's son and daughter in law patient woke up to episode of incontinence after taking 2 of her muscle relaxers this AM, patient also taking xanax  PRN, hx of vertigo, patient did not know where she was after LOC. Patient has had a migraine for 1 week and dealing with fibromyalgia flare up. Patient is alert and oriented x 4. Airway patent, respirations even and unlabored. Skin normal, warm and dry. patient has no complaints at this time. Siderails up x 2. Call light at bedside.

## 2024-09-27 NOTE — Discharge Instructions (Addendum)
 It was a pleasure caring for you today in the emergency department.  You were seen for episode of urinary incontinence, confusion and near syncope.  Reassuringly negative CT head and your lab workup was otherwise unremarkable.  It is possible that this is a medication side effect from the muscle relaxer and Xanax  that you take at home.  Please limit your use as able to prevent oversedation.  Please follow-up with your primary care doctor to discuss your symptoms further.  Please return to the emergency department for any worsening or worrisome symptoms.
